# Patient Record
Sex: Female | Born: 1975 | Race: Black or African American | Hispanic: No | Marital: Married | Smoking: Former smoker
Health system: Southern US, Community
[De-identification: ages and names within clinical notes are randomized; demographics above are authoritative.]

## PROBLEM LIST (undated history)

## (undated) DIAGNOSIS — E119 Type 2 diabetes mellitus without complications: Secondary | ICD-10-CM

## (undated) DIAGNOSIS — I10 Essential (primary) hypertension: Secondary | ICD-10-CM

## (undated) HISTORY — PX: MYOMECTOMY: SHX85

## (undated) HISTORY — PX: VAGINAL HYSTERECTOMY: SUR661

---

## 1997-11-27 ENCOUNTER — Ambulatory Visit (HOSPITAL_COMMUNITY): Admission: RE | Admit: 1997-11-27 | Discharge: 1997-11-27 | Payer: Self-pay | Admitting: Obstetrics

## 1997-12-06 ENCOUNTER — Observation Stay (HOSPITAL_COMMUNITY): Admission: AD | Admit: 1997-12-06 | Discharge: 1997-12-07 | Payer: Self-pay | Admitting: Obstetrics

## 1998-01-04 ENCOUNTER — Ambulatory Visit (HOSPITAL_COMMUNITY): Admission: RE | Admit: 1998-01-04 | Discharge: 1998-01-04 | Payer: Self-pay | Admitting: Obstetrics

## 1998-02-22 ENCOUNTER — Ambulatory Visit (HOSPITAL_COMMUNITY): Admission: RE | Admit: 1998-02-22 | Discharge: 1998-02-22 | Payer: Self-pay | Admitting: Obstetrics

## 1998-03-12 ENCOUNTER — Ambulatory Visit (HOSPITAL_COMMUNITY): Admission: RE | Admit: 1998-03-12 | Discharge: 1998-03-12 | Payer: Self-pay | Admitting: Obstetrics

## 1998-04-13 ENCOUNTER — Observation Stay (HOSPITAL_COMMUNITY): Admission: AD | Admit: 1998-04-13 | Discharge: 1998-04-14 | Payer: Self-pay | Admitting: Obstetrics

## 1998-04-30 ENCOUNTER — Observation Stay (HOSPITAL_COMMUNITY): Admission: AD | Admit: 1998-04-30 | Discharge: 1998-05-01 | Payer: Self-pay | Admitting: Obstetrics

## 1998-05-10 ENCOUNTER — Inpatient Hospital Stay (HOSPITAL_COMMUNITY): Admission: AD | Admit: 1998-05-10 | Discharge: 1998-05-14 | Payer: Self-pay | Admitting: Obstetrics

## 1998-11-13 ENCOUNTER — Emergency Department (HOSPITAL_COMMUNITY): Admission: EM | Admit: 1998-11-13 | Discharge: 1998-11-13 | Payer: Self-pay | Admitting: Emergency Medicine

## 1999-07-07 ENCOUNTER — Encounter: Payer: Self-pay | Admitting: Emergency Medicine

## 1999-07-07 ENCOUNTER — Emergency Department (HOSPITAL_COMMUNITY): Admission: EM | Admit: 1999-07-07 | Discharge: 1999-07-07 | Payer: Self-pay | Admitting: Emergency Medicine

## 2000-06-21 ENCOUNTER — Emergency Department (HOSPITAL_COMMUNITY): Admission: EM | Admit: 2000-06-21 | Discharge: 2000-06-21 | Payer: Self-pay | Admitting: Emergency Medicine

## 2000-12-13 ENCOUNTER — Encounter: Payer: Self-pay | Admitting: Obstetrics & Gynecology

## 2000-12-13 ENCOUNTER — Inpatient Hospital Stay (HOSPITAL_COMMUNITY): Admission: AD | Admit: 2000-12-13 | Discharge: 2000-12-15 | Payer: Self-pay | Admitting: Obstetrics & Gynecology

## 2001-10-14 ENCOUNTER — Encounter: Payer: Self-pay | Admitting: Family Medicine

## 2001-10-14 ENCOUNTER — Encounter: Admission: RE | Admit: 2001-10-14 | Discharge: 2001-10-14 | Payer: Self-pay | Admitting: Family Medicine

## 2003-03-22 ENCOUNTER — Emergency Department (HOSPITAL_COMMUNITY): Admission: EM | Admit: 2003-03-22 | Discharge: 2003-03-22 | Payer: Self-pay | Admitting: Emergency Medicine

## 2004-02-08 ENCOUNTER — Inpatient Hospital Stay (HOSPITAL_COMMUNITY): Admission: AD | Admit: 2004-02-08 | Discharge: 2004-02-08 | Payer: Self-pay | Admitting: Obstetrics & Gynecology

## 2004-02-19 ENCOUNTER — Encounter: Admission: RE | Admit: 2004-02-19 | Discharge: 2004-02-19 | Payer: Self-pay | Admitting: Obstetrics and Gynecology

## 2006-08-23 ENCOUNTER — Emergency Department (HOSPITAL_COMMUNITY): Admission: EM | Admit: 2006-08-23 | Discharge: 2006-08-23 | Payer: Self-pay | Admitting: Emergency Medicine

## 2007-09-06 ENCOUNTER — Inpatient Hospital Stay (HOSPITAL_COMMUNITY): Admission: AD | Admit: 2007-09-06 | Discharge: 2007-09-06 | Payer: Self-pay | Admitting: Obstetrics & Gynecology

## 2007-09-27 ENCOUNTER — Ambulatory Visit (HOSPITAL_COMMUNITY): Admission: RE | Admit: 2007-09-27 | Discharge: 2007-09-27 | Payer: Self-pay | Admitting: Obstetrics & Gynecology

## 2007-09-28 ENCOUNTER — Ambulatory Visit: Payer: Self-pay | Admitting: *Deleted

## 2007-09-28 ENCOUNTER — Encounter: Payer: Self-pay | Admitting: Obstetrics & Gynecology

## 2008-03-16 ENCOUNTER — Emergency Department (HOSPITAL_COMMUNITY): Admission: EM | Admit: 2008-03-16 | Discharge: 2008-03-16 | Payer: Self-pay | Admitting: Emergency Medicine

## 2008-07-30 ENCOUNTER — Emergency Department (HOSPITAL_COMMUNITY): Admission: EM | Admit: 2008-07-30 | Discharge: 2008-07-30 | Payer: Self-pay | Admitting: Emergency Medicine

## 2010-09-03 ENCOUNTER — Emergency Department (HOSPITAL_COMMUNITY): Admission: EM | Admit: 2010-09-03 | Discharge: 2010-09-03 | Payer: Self-pay | Admitting: Family Medicine

## 2010-09-10 ENCOUNTER — Ambulatory Visit: Payer: Self-pay | Admitting: Family Medicine

## 2010-09-10 ENCOUNTER — Other Ambulatory Visit
Admission: RE | Admit: 2010-09-10 | Discharge: 2010-09-10 | Payer: Self-pay | Source: Home / Self Care | Admitting: Family Medicine

## 2010-09-10 LAB — CONVERTED CEMR LAB
HCT: 25.1 % — ABNORMAL LOW (ref 36.0–46.0)
MCV: 60.9 fL — ABNORMAL LOW (ref 78.0–100.0)
Platelets: 370 10*3/uL (ref 150–400)
RBC: 4.12 M/uL (ref 3.87–5.11)
RDW: 23.8 % — ABNORMAL HIGH (ref 11.5–15.5)

## 2010-09-19 ENCOUNTER — Ambulatory Visit (HOSPITAL_COMMUNITY)
Admission: RE | Admit: 2010-09-19 | Discharge: 2010-09-19 | Payer: Self-pay | Source: Home / Self Care | Admitting: Family Medicine

## 2010-09-29 ENCOUNTER — Ambulatory Visit: Payer: Self-pay | Admitting: Family Medicine

## 2010-10-07 ENCOUNTER — Ambulatory Visit: Payer: Self-pay

## 2010-10-09 ENCOUNTER — Encounter: Payer: Self-pay | Admitting: *Deleted

## 2010-10-22 ENCOUNTER — Other Ambulatory Visit
Admission: RE | Admit: 2010-10-22 | Discharge: 2010-10-22 | Payer: Self-pay | Source: Home / Self Care | Admitting: Family Medicine

## 2010-10-22 ENCOUNTER — Ambulatory Visit: Payer: Self-pay | Admitting: Obstetrics and Gynecology

## 2010-10-23 ENCOUNTER — Ambulatory Visit: Payer: Self-pay | Admitting: Family Medicine

## 2010-10-23 ENCOUNTER — Encounter: Payer: Self-pay | Admitting: Family Medicine

## 2010-10-23 DIAGNOSIS — E669 Obesity, unspecified: Secondary | ICD-10-CM

## 2010-10-23 DIAGNOSIS — I10 Essential (primary) hypertension: Secondary | ICD-10-CM | POA: Insufficient documentation

## 2010-10-23 DIAGNOSIS — D500 Iron deficiency anemia secondary to blood loss (chronic): Secondary | ICD-10-CM | POA: Insufficient documentation

## 2010-10-23 LAB — CONVERTED CEMR LAB
BUN: 10 mg/dL (ref 6–23)
Calcium: 9.8 mg/dL (ref 8.4–10.5)
Creatinine, Ser: 0.69 mg/dL (ref 0.40–1.20)
Hemoglobin: 8 g/dL — ABNORMAL LOW (ref 12.0–15.0)
MCHC: 28.8 g/dL — ABNORMAL LOW (ref 30.0–36.0)
Platelets: 368 10*3/uL (ref 150–400)
RDW: 24.4 % — ABNORMAL HIGH (ref 11.5–15.5)
WBC: 8 10*3/uL (ref 4.0–10.5)

## 2010-10-26 HISTORY — PX: VAGINAL HYSTERECTOMY: SUR661

## 2010-11-05 ENCOUNTER — Ambulatory Visit: Admit: 2010-11-05 | Payer: Self-pay | Admitting: Obstetrics and Gynecology

## 2010-11-12 ENCOUNTER — Ambulatory Visit: Admit: 2010-11-12 | Payer: Self-pay | Admitting: Obstetrics and Gynecology

## 2010-11-14 ENCOUNTER — Ambulatory Visit: Admit: 2010-11-14 | Payer: Self-pay

## 2010-11-17 ENCOUNTER — Inpatient Hospital Stay (HOSPITAL_COMMUNITY)
Admission: AD | Admit: 2010-11-17 | Discharge: 2010-11-17 | Payer: Self-pay | Source: Home / Self Care | Attending: Obstetrics & Gynecology | Admitting: Obstetrics & Gynecology

## 2010-11-18 LAB — URINALYSIS, ROUTINE W REFLEX MICROSCOPIC
Nitrite: NEGATIVE
Specific Gravity, Urine: 1.02 (ref 1.005–1.030)
Urobilinogen, UA: 0.2 mg/dL (ref 0.0–1.0)
pH: 5.5 (ref 5.0–8.0)

## 2010-11-18 LAB — URINE MICROSCOPIC-ADD ON

## 2010-11-18 LAB — POCT PREGNANCY, URINE: Preg Test, Ur: NEGATIVE

## 2010-11-20 ENCOUNTER — Ambulatory Visit: Admit: 2010-11-20 | Payer: Self-pay

## 2010-11-27 NOTE — Assessment & Plan Note (Signed)
Summary: np/referred from womens/eo   Vital Signs:  Patient profile:   35 year old female Height:      66 inches Weight:      229 pounds BMI:     37.10 Temp:     98.8 degrees F oral Pulse rate:   89 / minute Pulse rhythm:   regular BP sitting:   171 / 94  (left arm) Cuff size:   regular  Vitals Entered By: Loralee Pacas CMA (October 23, 2010 9:57 AM)   CC: referred from [facility]   Primary Yazaira Speas:  Dessa Phi   CC:  referred from [facility].  History of Present Illness: CC: HTN  1. HTN: Pt referred from [facility] where she is being worked-up and treated for uterine fibroid and menorrahgia. During her visits she was found to be hypertensive with SBPs: 150s and DBP 90s-100. She has a hx of HTN diagnosed 2 years ago. She was not taking medcations until 09/03/10 when she went urgent care and was found t have BP of 190/110. T  Upon ROS: she reports ocassionally seeing "stars" in her visual field, she last saw them this AM. She also reports decreased exercise tolerance and becoming more SOB when walking upstairs.  She denies frequent HAs, CP,  focal weakness, tingling/numbness. Discolored urine. She also denies intolerance to heat/cold.    2. Obesity: BMI 37 Pt has had a 60 lbs weight gain over the last year. She admits to the separation between herself and her husband being a major stressor. She reports increased intake of high calorie foods (including fried foods) and  decreased activity. She admits to worsening snoring over the last year.    Habits & Providers  Alcohol-Tobacco-Diet     Tobacco Status: quit  Past History:  Past Medical History: Hypertension Abnormal Pap- LGSIL s/p colposcopy 10/22/10.  Past Surgical History: C-section 1999 G3P0313 last pregnancy --> twins  Family History: Family History Hypertension- mother, maternal gradmother Diabetes- grandmother Father-deceased, accident on the job. Mother-living, 55.  No siblings.   Social History: Technical sales engineer  at [employer] Separated 3 children- 57, 53 yo twins.  Former Smoker quit in 1996 ETOH-ocassional IDU- denies No regular exercise Poor diet.  Occupation:  employed Smoking Status:  quit  Review of Systems       As per HPI  Physical Exam  General:  Well-developed,well-nourished,in no acute distress; alert,appropriate and cooperative throughout examination Eyes:  No corneal or conjunctival inflammation noted. EOMI. Perrla Lungs:  Normal respiratory effort, chest expands symmetrically. Lungs are clear to auscultation, no crackles or wheezes. Heart:  Normal rate and regular rhythm. S1 and S2 normal without gallop, murmur, click, rub or other extra sounds. Abdomen:  Bowel sounds positive,abdomen soft and non-tender.  Skin:  Intact without suspicious lesions or rashes   Impression & Recommendations:  Problem # 1:  HYPERTENSION (ICD-401.9) Uncontrolled primary HTN. Will check a BMET. Cr for ACE started at urgent care on 09/03/10. Obtained  baseline EKG NS, no ST elevation/depression, normal axis. Her updated medication list for this problem includes:    Lisinopril-hydrochlorothiazide 20-25 Mg Tabs (Lisinopril-hydrochlorothiazide) ..... One tab daily in am.  Orders: Basic Met-[facility] [id number]) Basic Met-[facility] [id number]) CBC-[facility] [id number]) 12 Lead EKG (12 Lead EKG)Future Orders: Lipid-[facility] ([id number]) ... 09/26/2011  Problem # 2:  OBESITY, UNSPECIFIED (ICD-278.00) See pt instructions for weightloss plan.   Complete Medication List: 1)  Lisinopril-hydrochlorothiazide 20-25 Mg Tabs (Lisinopril-hydrochlorothiazide) .... One tab daily in am. 2)  Maryanna Shape 0.4-35 Mg-mcg Tabs (Norethindrone-eth estradiol) .Marland KitchenMarland KitchenMarland Kitchen  One tab daily. 3)  Ferrous Sulfate 325 (65 Fe) Mg Tabs (Ferrous sulfate) .... One tab daily 4)  Colace 100 Mg Caps (Docusate sodium) .... One tab one to two time a day.   Patient Instructions: 1)  Ms. Carnell, 2)  Thank you for coming in today. 3)  It was a  pleasure meeting you. 4)  For your HTN: I am adding a diuretic to your regimen. It will be in combination with your lisinopril.  5)  For weight loss: The plan we discussed is a good one to get you back to your previous weight 165 -170 lbs. 6)  Goal: 2-4lbs weight loss each mos.  7)  Plan: Walk 30-45 mins, 3x/week. Wii 1-2x/weeks with kids. 8)  Diet: cut out fried foods (replace with steamed, baked, broiled, grilled). 9)  f/u in 1 mos.  10)  Come back fasting before your f/u appt so the lab can check your cholesterol.  11)  Have a happy new year. 12)  -Dr. Armen Pickup Prescriptions: COLACE 100 MG CAPS (DOCUSATE SODIUM) one tab one to two time a day.  #60 x 3   Entered and Authorized by:   Dessa Phi MD   Signed by:   Dessa Phi MD on 10/23/2010   Method used:   Electronically to        [facility] [address] [phone]* (retail)       [address]       [place], [place]  [address]       Ph: [phone] or [phone]       Fax: [phone]   RxID:   [id number] LISINOPRIL-HYDROCHLOROTHIAZIDE 20-25 MG TABS (LISINOPRIL-HYDROCHLOROTHIAZIDE) one tab daily in AM.  #30 x 1   Entered and Authorized by:   Dessa Phi MD   Signed by:   Dessa Phi MD on 10/23/2010   Method used:   [facility]        [facility] [address] [phone]* (retail)       [address]       [place], [place]  [address]       Ph: [phone] or [phone]       Fax: [phone]   RxID:   [id number]    Orders Added: 1)  Basic Met-[facility] [[id number]] 2)  Basic Met-[facility] [[id number]] 3)  CBC-[facility] [85027] 4)  12 Lead EKG [12 Lead EKG] 5)  Lipid-[facility] [[id number]] 6)  [facility] [[address]]

## 2010-11-27 NOTE — Miscellaneous (Signed)
Summary: Do Not Reschedule  Missed NP appt w/o canceling.  Per [facility] policy is not allowed to reschedule.  Dennison Nancy RN  October 09, 2010 3:32 PM

## 2010-11-28 ENCOUNTER — Encounter (HOSPITAL_COMMUNITY)
Admission: RE | Admit: 2010-11-28 | Discharge: 2010-11-28 | Disposition: A | Discharge status: Home / Self Care | Payer: BC Managed Care – PPO | Source: Ambulatory Visit | Attending: Family Medicine | Admitting: Family Medicine

## 2010-11-28 DIAGNOSIS — Z01812 Encounter for preprocedural laboratory examination: Secondary | ICD-10-CM | POA: Insufficient documentation

## 2010-11-28 LAB — BASIC METABOLIC PANEL
Calcium: 9.1 mg/dL (ref 8.4–10.5)
Creatinine, Ser: 0.6 mg/dL (ref 0.4–1.2)
GFR calc Af Amer: >60 mL/min (ref >60)
GFR calc non Af Amer: >60 mL/min (ref >60)
Potassium: 3.7 mEq/L (ref 3.5–5.1)

## 2010-11-28 LAB — CBC: WBC: 6.4 10*3/uL (ref 4.0–10.5)

## 2010-11-28 LAB — SURGICAL PCR SCREEN: MRSA, PCR: NEGATIVE

## 2010-12-03 ENCOUNTER — Other Ambulatory Visit: Payer: Self-pay | Admitting: Family Medicine

## 2010-12-03 ENCOUNTER — Ambulatory Visit (HOSPITAL_COMMUNITY)
Admission: RE | Admit: 2010-12-03 | Discharge: 2010-12-04 | Disposition: A | Discharge status: Home / Self Care | Payer: BC Managed Care – PPO | Source: Ambulatory Visit | Attending: Family Medicine | Admitting: Family Medicine

## 2010-12-03 DIAGNOSIS — D649 Anemia, unspecified: Secondary | ICD-10-CM | POA: Insufficient documentation

## 2010-12-03 DIAGNOSIS — N920 Excessive and frequent menstruation with regular cycle: Secondary | ICD-10-CM

## 2010-12-03 DIAGNOSIS — D259 Leiomyoma of uterus, unspecified: Secondary | ICD-10-CM

## 2010-12-03 DIAGNOSIS — N8000 Endometriosis of the uterus, unspecified: Secondary | ICD-10-CM | POA: Insufficient documentation

## 2010-12-03 LAB — ABO/RH: ABO/RH(D): B POS

## 2010-12-04 LAB — CBC
HCT: 23.5 % — ABNORMAL LOW (ref 36.0–46.0)
Hemoglobin: 6.9 g/dL — CL (ref 12.0–15.0)
RBC: 3.36 MIL/uL — ABNORMAL LOW (ref 3.87–5.11)
WBC: 12.4 10*3/uL — ABNORMAL HIGH (ref 4.0–10.5)

## 2010-12-07 LAB — CROSSMATCH: Unit division: 0

## 2010-12-15 NOTE — Op Note (Signed)
NAME:  Debra Mccormick, Debra Mccormick              ACCOUNT NO.:  [id number]  MEDICAL RECORD NO.:  [id number]           PATIENT TYPE:  I  LOCATION:  9320                          [facility]  PHYSICIAN:  Pearson Picou S. Debra Mccormick, M.D.   DATE OF BIRTH:  [birth year]  DATE OF PROCEDURE: DATE OF DISCHARGE:                              OPERATIVE REPORT   PREOPERATIVE DIAGNOSES: 1. Fibroid uterus. 2. Menorrhagia. 3. Anemia.  POSTOPERATIVE DIAGNOSES: 1. Fibroid uterus. 2. Menorrhagia. 3. Anemia.  PROCEDURE:  TVH.  SURGEON:  Debra Mccormick. Debra Pons, MD  ASSISTANT:  Horton Chin, MD  ANESTHESIA:  General local.  FINDINGS:  690 g multiple fibroid uterus with several areas of necrosis.  SPECIMEN:  Right tube and uterus and cervix to Pathology.  BLOOD LOSS:  400 mL.  COMPLICATIONS:  None immediately known.  REASON FOR PROCEDURE:  Briefly, patient is a 35 year old gravida 3, para 2-0-1-3 who had a history of a previous long history of fibroid uterus and had menorrhagia and pelvic pain associated with this.  She had had her hemoglobin down to 6.6 in the past, it was up to 8.8, at the time of procedure.  She had failed oral contraceptives and desired permanent treatment.  PROCEDURE IN DETAIL:  The patient was taken to the OR.  She was placed in dorsal lithotomy in [place] stirrups.  She was prepped and draped in usual sterile fashion.  She had SCDs in place, a gram of Ancef had been infused, a time-out was performed.  A weighted speculum was placed at the vagina.  The cervix was visualized and grasped with two double-tooth tenaculums on the anterior and posterior lip of the cervix.  She was injected with 20 mL of 1% lidocaine with epinephrine 1:100.  A circumferential incision was made about the vagina.  The vagina was then pushed back off the cervix.  The posterior peritoneal cavity was entered sharply with Mayo scissors and the patient peritoneum tagged to the posterior vagina.  Attempt was made to get  in anteriorly.  However, the plane could not be found accompanying this.  The patient had a previous C-section.  Sharp and blunt dissection were done until we felt we were high enough on the cervix to not injure the bladder.  A Heaney clamp was used to clamp the uterosacral ligaments bilaterally, these were cut and suture ligated it in a Heaney-type stitch and tagged.  Two more bites were taken on each side to effect the uterine vessels bilaterally. These were clamped, cut, and suture ligated.  Once the uterine vessels were obtained, we set about coring the uterus, and then removed  with multiple morcellations. It is a very large uterus 690 g and came out in multiple pieces.  Several fibroids were also removed during this with care being taken to prevent bleeding by using traction on the uterus.   The patient's right tuboovarian pedicle was Heaney clamped, was  picked up to the top of the uterus.  The patient's left tubo-ovarian wall what was thought to be a tubo-ovarian pedicle was clamped.   The remaining piece of the uterus were collected.  Several  Heaney  clamps were used just to be sure there was no bleeding, and  clamp across the final connection was done on the patient's  right side.  On the left, a free tie followed by Heaney type suture was used on the pedicle on the left side.  Several Heaney-type sutures were done on Heaney clamps and the right tube was bleeding so Heaney clamp was placed over this and this was removed and was suture ligated.  At the end of the case, the tubo-ovarian pedicles and the uterosacral ligaments were inspected as well as all the pedicles leadding up and were felt to be hemostatic.  There was some blood loss from the vaginal cuff, but that was all.  Vaginal cuff was then closed with a 0 Vicryl suture in a locked running fashion to incorporate the uterosacral ligaments on either side to the vaginal cuff.  A 2-inch packing with Estrace was then placed  inside the vagina.  A Foley catheter was placed inside the patient's bladder.  Clear yellow urine was noted.  All instrument and lap counts were correct x2.  The patient was awakened and taken to recovery room in stable condition.     Debra Mccormick. Debra Mccormick, M.D.     TSP/MEDQ  D:  12/03/2010  T:  12/04/2010  Job:  [id number]  Electronically Signed by Tinnie Gens M.D. on 12/15/2010 09:32:28 AM

## 2010-12-15 NOTE — Discharge Summary (Signed)
  NAME:  Debra Mccormick, Debra Mccormick              ACCOUNT NO.:  [id number]  MEDICAL RECORD NO.:  [id number]           PATIENT TYPE:  I  LOCATION:  9320                          [facility]  PHYSICIAN:  Anvay Tennis S. Shawnie Pons, M.D.   DATE OF BIRTH:  [birth year]  DATE OF ADMISSION:  12/03/2010 DATE OF DISCHARGE:  12/04/2010                              DISCHARGE SUMMARY   FINAL DIAGNOSES: 1. Fibroid uterus. 2. Menorrhagia. 3. Chronic anemia from above. 4. Obesity. 5. Hypertension.  PROCEDURE:  Transvaginal hysterectomy.  PERTINENT LABORATORY FINDINGS:  B positive blood type, negative antibody screen, she was crossmatched for 2 units.  Starting hemoglobin of 8.8, postoperative hemoglobin of 6.9.  Urine pregnancy test was negative. She is positive staph aureus but not MRSA on her surgical screen.  Other important findings include a 16-week size uterus with multiple fibroids. The patient had a negative endometrial biopsy, had an abnormal Pap of low-grade SIL with a normal colpo biopsy.  REASON FOR HOSPITALIZATION:  Briefly, the patient is a 35 year old G3, P 2-0-1-3 who has a history of fibroid uterus and chronic anemia.  She desired definitive treatment and was admitted for the above procedure.  HOSPITAL COURSE:  The patient was admitted.  She underwent the above procedure.  She did well postoperatively.  She was transferred to the floor.  Once there, she had stable vital signs.  She remained afebrile. She had adequate urine output.  Her Foley and vaginal packing were removed on postop day #1 without difficulty.  The patient was tolerating regular diet.  She had passed flatus.  Her pain was well-controlled and she was able to void without difficulty.  She was able to ambulate without dizziness prior to discharge.  DISCHARGE DISPOSITION AND CONDITION:  The patient discharged home in good condition.  Follow up in the GYN Clinic in approximately 2 weeks. She is given a prescription for Percocet  5/325 one to two p.o. q.4-6 h. p.r.n. severe pain.  Otherwise, she will continue her regular home medications which include Biotin 100 mcg p.o. daily, iron 27 mg 1 daily. She will discontinue her oral contraceptives.  Ibuprofen 800 mg as needed for pain and lisinopril/HCTZ 20/25 one p.o. daily.    Shelbie Proctor. Shawnie Pons, M.D.    TSP/MEDQ  D:  12/04/2010  T:  12/04/2010  Job:  [id number]  Electronically Signed by Tinnie Gens M.D. on 12/15/2010 09:27:41 AM

## 2010-12-17 ENCOUNTER — Ambulatory Visit (INDEPENDENT_AMBULATORY_CARE_PROVIDER_SITE_OTHER): Payer: BC Managed Care – PPO | Admitting: Family Medicine

## 2010-12-17 DIAGNOSIS — Z09 Encounter for follow-up examination after completed treatment for conditions other than malignant neoplasm: Secondary | ICD-10-CM

## 2011-01-06 LAB — POCT I-STAT, CHEM 8
BUN: 8 mg/dL (ref 6–23)
Creatinine, Ser: 0.7 mg/dL (ref 0.4–1.2)
HCT: 29 % — ABNORMAL LOW (ref 36.0–46.0)

## 2011-01-06 LAB — POCT PREGNANCY, URINE: Preg Test, Ur: NEGATIVE

## 2011-01-14 ENCOUNTER — Ambulatory Visit: Payer: BC Managed Care – PPO | Admitting: Physician Assistant

## 2011-01-16 NOTE — Progress Notes (Signed)
NAME:  Debra Mccormick, HAMMILL NO.:  [id number]  MEDICAL RECORD NO.:  [id number]           PATIENT TYPE:  A  LOCATION:  [facility]                   [facility]  PHYSICIAN:  Tinnie Gens, MD        DATE OF BIRTH:  08/05/1976  DATE OF SERVICE:  12/17/2010                                 CLINIC NOTE  CHIEF COMPLAINT:  Surgical followup.  HISTORY OF PRESENT ILLNESS:  The patient is a 35 year old gravida 3, para 2-0-1-3 who underwent TVH for a large fibroid uterus who returned on December 03, 2010.  She returns today for 2-week followup.  She is without complaint.  She denies vaginal discharge, vaginal bleeding, fevers, chills, nausea, vomiting, diarrhea.  She did have some constipation that seems to be improving.  She is not requiring pain medicine at this time.  She feels like she is becoming more like herself and doing well every day.  Her pathology is reviewed today shows secretory phase endometrium with no hyperplasia or malignancy, benign fibroids up to 6 cm, uterine adenomyosis, benign cervical mucosa, uterine serosal adhesions, and right and left fallopian tube transected.  PHYSICAL EXAMINATION:  VITAL SIGNS:  As noted in the chart.  Blood pressure is mildly elevated at 146/94, weight is 230 pounds, pulse 89, temperature 97.9. ABDOMEN:  Soft, nontender, nondistended. GU:  Normal external female genitalia.  BUS is normal.  Vagina is pink and rugated.  Sutures are visible on the top of the cuff.  On bimanual, there is no pelvic mass or significant tenderness.  IMPRESSION:  Status post total vaginal hysterectomy for fibroid uterus, doing exceedingly well.  PLAN:  We will follow up in 4 weeks for final postop check and release back to work.          ______________________________ Tinnie Gens, MD    TP/MEDQ  D:  12/17/2010  T:  12/18/2010  Job:  [id number]

## 2011-01-22 ENCOUNTER — Ambulatory Visit (INDEPENDENT_AMBULATORY_CARE_PROVIDER_SITE_OTHER): Payer: BC Managed Care – PPO | Admitting: Physician Assistant

## 2011-01-22 DIAGNOSIS — Z09 Encounter for follow-up examination after completed treatment for conditions other than malignant neoplasm: Secondary | ICD-10-CM

## 2011-01-23 NOTE — Progress Notes (Signed)
NAME:  Debra Mccormick, Debra Mccormick              ACCOUNT NO.:  [id number]  MEDICAL RECORD NO.:  [id number]           PATIENT TYPE:  A  [facility]ATION:  [facility]                   [facility]  PHYSICIAN:  Maylon Cos, CNM    DATE OF BIRTH:  1976-03-14  DATE OF SERVICE:  01/22/2011                                 CLINIC NOTE  The patient is being seen in GYN Clinic at [facility].  REASON FOR TODAY'S VISIT:  Eight week followup postop check from a TVH.  HISTORY OF PRESENT ILLNESS:  The patient is a 35 year old gravida 3, para 2-0-1-3 who underwent a TVH for large fibroid uterus on December 03, 2010.  She was seen for 4-week postop visit on December 17, 2010,  and was doing excellent.  She returns today with no complaints.  She is not requiring any pain medications.  No vaginal discharge.  No abnormal pain.  Desiring to return back to work.  PHYSICAL EXAMINATION:  GENERAL:  Debra Mccormick is a pleasant African American female in no apparent distress. HEENT:  Grossly normal. VITAL SIGNS:  Her blood pressure is mildly elevated at 143/95, recheck is 142/89.  Her weight today is 235 and height is 66 inches.  ABDOMEN: Soft, nontender, and nondistended. GU:  Normal external genitalia.  Vagina is pink with scant discharge, irregular rugae.  Sutures have healed completely.  Cuff is intact. Bimanual exam reveals absent uterus.  No pelvic masses.  No tenderness. The patient's ovaries are still intact, but unable to be appreciated on examination.  IMPRESSION:  Status post total vaginal hysterectomy for fibroid uterus, doing excellent.  PLAN:  The patient is released back to work without restrictions.  Note is given the patient to return on Monday, January 26, 2011, without restrictions.  The patient is also instructed to follow up with her primary care provider, Dr. Armen Pickup at [facility] secondary to her mild hypertension.  She has appointment in 2 weeks. She is encouraged to increase her  fluid intake and decrease her sodium intake.  No changes are made to her hypertensive medication, but she is currently on lisinopril 10 mg p.o. daily.          ______________________________ Maylon Cos, CNM    SS/MEDQ  D:  01/22/2011  T:  01/23/2011  Job:  [id number]

## 2011-03-10 NOTE — Group Therapy Note (Signed)
NAME:  Debra Mccormick, Debra Mccormick NO.:  [id number]   MEDICAL RECORD NO.:  [id number]          PATIENT TYPE:  WOC   [facility]ATION:  [facility]                   [facility]   PHYSICIAN:  Karlton Lemon, MD      DATE OF BIRTH:  [birth year]   DATE OF SERVICE:                                  CLINIC NOTE   CHIEF COMPLAINT:  Pelvic pain with periods and heavy periods.   HISTORY OF PRESENT ILLNESS:  This is a 35 year old G3 P3-0-0-4 with  history of twins delivery.  Her last delivery was in 1999, at which time  she had a postpartum tubal ligation.  She says since that time she has  had heavy periods lasting 8 to 9 days requiring 8 pads on her heaviest  day.  She states that she soaks through her panties when she has her  bleeding.  She does not soak her sheets at night.  She states this is a  change from her previous menstrual cycles which lasted 5 days and were  lighter.  She states that her cramping has gotten worse as well.  Currently her last menstrual period ended on September 15, 2007.  She  states she has had a trial of oral contraceptive pills in the past and  they worked well but she cannot afford them anymore.  She was seen in  the MAU on September 06, 2007 at which time she had cultures drawn.  GC  and chlamydia was negative.  Hemoglobin was 8.1 at that time.  She was  started on iron.   PAST MEDICAL HISTORY:  Anemia.   PAST SURGICAL HISTORY:  1. Bilateral tubal ligation  2. Cesarean section.   MEDICATIONS:  1. Iron sulfate 1 tab twice daily.  2. Ibuprofen p.r.n.   ALLERGIES:  NO KNOWN DIAGNOSED ALLERGIES.   PHYSICAL EXAMINATION:  GENERAL:  This is a well-appearing obese female  in no distress.  VITAL SIGNS:  Temperature 98.6.  Pulse 81.  Blood pressure 148/93.  HEART:  Regular rate and rhythm.  No murmurs, rubs or gallops.  Clear to  auscultation bilaterally.  ABDOMEN:  Obese, soft, nontender to palpation.  GENITOURINARY:  Vaginal mucosa is pink and moist.   Clear discharge.  Cervix is midline without lesions noted.  Pap smear performed by manual  exam with midline uterus approximately 10 weeks in size.  No cervical  motion tenderness.  Adnexa are difficult to palpate secondary to body  habitus.   ASSESSMENT/PLAN:  This is a 35 year old African American female, G3 P3-0-  0-4 presenting with pelvic pain and heavy menstrual periods and history  of no recent Pap smear.   PLAN:  1. Pap smear performed today.  2. Options were discussed with the patient regarding management of her      dysmenorrhea including oral contraceptive pills, IUD, ablation and      hysterectomy.  The patient is self-paid and would like to have a      trial of oral contraceptive pills.  She was provided with a      prescription for Sprintec and instructed that she may fill it at  [facility] for nine dollars for a month's supply.  She is instructed      to follow up in 3 months, at which time she should have repeat      hemoglobin done.  At that time if oral contraceptive pills are not      working, other options can be discussed.           ______________________________  Karlton Lemon, MD     NS/MEDQ  D:  09/28/2007  T:  09/28/2007  Job:  [id number]

## 2011-03-13 NOTE — Discharge Summary (Signed)
[facility]  Patient:    Debra Mccormick, Debra Mccormick                   MRN: [id number] Adm. Date:  [id number] Disc. Date: [id number] Attending:  Antionette Char DictatorTurner Daniels, M.D.                           Discharge Summary  PROCEDURE:                    Abdominal ultrasound which revealed normal uterus, left hydrosalpinx with possible right hydrosalpinx, ovaries not clearly seen, but no adnexal mass.  No fluid in the cul-de-sac.  SUMMARY:                      Patient is a 35 year old G3, P1-1-1-3 whose last menstrual period was November 27, 2000.  Patient was seen in maternity admissions for low abdominal pain and back pain, vaginal discharge.  Patient ranked the pain as very severe.  She does have a history of bilateral tubal ligation and twins at [redacted] weeks gestation.  Patient did have a cesarean section in 1999.  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 Patient was afebrile on examination and was found to have soft abdomen with positive bowel sounds and tender to palpation bilaterally with guarding left greater than the right.  No CVA tenderness.  Cervical examination revealed a friable cervix with positive cervical motion tenderness.  LABORATORIES:                 Beta hCG was negative.  White blood cell count 10.9, hemoglobin 8.4, hematocrit 27.7, platelet count 365.  Wet prep revealed few yeast, no Trichomonas, no clue cells, too numerous to count white cells, too numerous to count bacteria.  GC and chlamydia negative.  Urine was found to have negative nitrites, but positive leukocyte esterase per report on an outside hospital.                                Patient was admitted to [facility] teaching service and started on IV antibiotics.  Patient was given Vicodin p.r.n. for pain.  She was started on Cipro, Flagyl, and Diflucan x 1.  She was also started on doxycycline.  Patient did well and the second day of  hospitalization was found to be greatly improved.  Her antibiotics were changed to p.o. which she tolerated well.  The following day she was deemed appropriate to be discharged to home with a total of a 14 day course of antibiotic therapy.  CONDITION ON DISCHARGE:       Good.  DISPOSITION:                  Discharged to home.  DISCHARGE MEDICATIONS:        1. Flagyl 500 mg one tab p.o. b.i.d. x 11 days.                               2. Cipro 500 mg one tab p.o. b.i.d. x 11 days.                               3. Doxycycline 100 mg one tab p.o.  b.i.d. x 11                                  days.  INSTRUCTIONS:                 Patient was given no specific activity or diet instructions.  She was told that she needs to take all of her medications for the complete course even if she starts feeling better.  She was given specific signs and symptoms to warrant further treatment.  FOLLOW-UP:                    Patient will follow-up at [facility] in two weeks.  Patient will call for an appointment.  Patient voiced agreement and understanding of the above discharge plans and had no further questions. DD:  12/15/00 TD:  12/15/00 Job: [id number]

## 2011-03-13 NOTE — Group Therapy Note (Signed)
NAME:  Debra Mccormick, Debra Mccormick                      ACCOUNT NO.:  [id number]   MEDICAL RECORD NO.:  [id number]                   PATIENT TYPE:  OUT   LOCATION:  [facility]                           FACILITY:  [facility]   PHYSICIAN:  Argentina Donovan, MD                     DATE OF BIRTH:  1976-09-12   DATE OF SERVICE:  02/19/2004                                    CLINIC NOTE   REASON FOR VISIT:  The patient is a 35 year old gravida 4 para 2-0-1-3 with  a history of a normal vaginal delivery followed by a C-section for twins age  53.  The patient was seen in the [facility] for abdominal pain a week-and-a-half ago  and treated for PID.  While she was there she had a sonogram that showed a  right pyo- or hydrosalpinx.  The patient had a history of a hospitalization  in 2002 for pelvic inflammatory disease and at that time an ultrasound was  almost identical to the one she had done a few days ago.  This is a lady who  has a normal bowel movement every day and went several days without one and  once she did have one the pain was relieved.  I think her main problem at  that time was constipation.  However, he patient was worked up and had had a  tubal ligation.  She has been extremely anemic with a hemoglobin of around  7.  She has had very heavy periods since her tubal ligation 5 years ago and  takes no iron supplement.  I have a feeling this lady has an iron deficiency  anemia which we are going to work her up for.  We are going to place her on  iron and also on oral contraceptives in order to decrease the amount of  bleeding she is doing until her blood count gets back to normal.  I have  discussed with her the reason that we should reduce the work on her heart  and make her generally feel better by increasing her hemoglobin.  Pap smear  was done.   EXAMINATION:  The patient is soft, flat, nontender, no mass or organomegaly  on abdominal exam.  External genitalia is normal, BUS within normal limits.  The  vagina is clean and well rugated.  The cervix is clean and parous.  The  uterus anterior; normal size, shape, and consistency with normal adnexa.   IMPRESSION:  1. Normal gynecological examination.  2. Severe anemia, probable iron deficiency.   PLAN:  Iron supplementation, i.e. Hemocyte Plus; and oral contraceptives,  i.e. Ovcon-35.  Also, we are drawing a ferritin, iron binding capacity, and  hemoglobin electrophoresis.  Argentina Donovan, MD    PR/MEDQ  D:  02/19/2004  T:  02/19/2004  Job:  [id number]

## 2011-07-28 LAB — RAPID STREP SCREEN (MED CTR MEBANE ONLY): Streptococcus, Group A Screen (Direct): NEGATIVE

## 2011-08-04 LAB — HEMOGLOBINOPATHY EVALUATION
Hemoglobin Other: 0 (ref 0.0–0.0)
Hgb F Quant: 0 (ref 0.0–2.0)

## 2011-08-04 LAB — URINALYSIS, ROUTINE W REFLEX MICROSCOPIC
Glucose, UA: NEGATIVE
Ketones, ur: NEGATIVE
Nitrite: NEGATIVE
Protein, ur: NEGATIVE
Specific Gravity, Urine: 1.025

## 2011-08-04 LAB — TSH: TSH: 1.488

## 2011-08-04 LAB — CBC
MCV: 59.8 — ABNORMAL LOW
RBC: 4.38
WBC: 6.6

## 2011-08-04 LAB — VON WILLEBRAND PANEL
Ristocetin Co-Factor: 148 % (ref 50–150)
Von Willebrand Ag: 214 % normal — ABNORMAL HIGH (ref 61–164)

## 2011-08-04 LAB — WET PREP, GENITAL: Trich, Wet Prep: NONE SEEN

## 2011-08-04 LAB — GC/CHLAMYDIA PROBE AMP, GENITAL
Chlamydia, DNA Probe: NEGATIVE
GC Probe Amp, Genital: NEGATIVE

## 2012-10-08 ENCOUNTER — Encounter (HOSPITAL_COMMUNITY): Payer: Self-pay

## 2012-10-08 ENCOUNTER — Emergency Department (HOSPITAL_COMMUNITY)
Admission: EM | Admit: 2012-10-08 | Discharge: 2012-10-08 | Disposition: A | Discharge status: Home / Self Care | Payer: Self-pay | Attending: Emergency Medicine | Admitting: Emergency Medicine

## 2012-10-08 DIAGNOSIS — Z79899 Other long term (current) drug therapy: Secondary | ICD-10-CM | POA: Insufficient documentation

## 2012-10-08 DIAGNOSIS — I10 Essential (primary) hypertension: Secondary | ICD-10-CM | POA: Insufficient documentation

## 2012-10-08 DIAGNOSIS — R51 Headache: Secondary | ICD-10-CM | POA: Insufficient documentation

## 2012-10-08 DIAGNOSIS — R7309 Other abnormal glucose: Secondary | ICD-10-CM | POA: Insufficient documentation

## 2012-10-08 DIAGNOSIS — Z87891 Personal history of nicotine dependence: Secondary | ICD-10-CM | POA: Insufficient documentation

## 2012-10-08 DIAGNOSIS — R739 Hyperglycemia, unspecified: Secondary | ICD-10-CM

## 2012-10-08 HISTORY — DX: Essential (primary) hypertension: I10

## 2012-10-08 LAB — URINE MICROSCOPIC-ADD ON

## 2012-10-08 LAB — URINALYSIS, ROUTINE W REFLEX MICROSCOPIC
Glucose, UA: 100 mg/dL — AB
Ketones, ur: NEGATIVE mg/dL
Nitrite: NEGATIVE
Protein, ur: NEGATIVE mg/dL
Urobilinogen, UA: 0.2 mg/dL (ref 0.0–1.0)

## 2012-10-08 LAB — BASIC METABOLIC PANEL
CO2: 23 mEq/L (ref 19–32)
Calcium: 10.1 mg/dL (ref 8.4–10.5)
Creatinine, Ser: 0.53 mg/dL (ref 0.50–1.10)
GFR calc Af Amer: >90 mL/min (ref >90)
GFR calc non Af Amer: >90 mL/min (ref >90)
Sodium: 136 mEq/L (ref 135–145)

## 2012-10-08 LAB — RAPID URINE DRUG SCREEN, HOSP PERFORMED
Amphetamines: NOT DETECTED
Opiates: NOT DETECTED

## 2012-10-08 LAB — CBC WITH DIFFERENTIAL/PLATELET
Basophils Absolute: 0 10*3/uL (ref 0.0–0.1)
Basophils Relative: 0 % (ref 0–1)
Eosinophils Relative: 2 % (ref 0–5)
Lymphocytes Relative: 40 % (ref 12–46)
MCHC: 33.9 g/dL (ref 30.0–36.0)
MCV: 84.1 fL (ref 78.0–100.0)
Monocytes Absolute: 0.3 10*3/uL (ref 0.1–1.0)
Platelets: 261 10*3/uL (ref 150–400)
RDW: 14.4 % (ref 11.5–15.5)
WBC: 5.5 10*3/uL (ref 4.0–10.5)

## 2012-10-08 MED ORDER — LISINOPRIL-HYDROCHLOROTHIAZIDE 20-25 MG PO TABS: 1.0000 | ORAL_TABLET | Freq: Every day | ORAL | Status: DC

## 2012-10-08 NOTE — ED Notes (Signed)
Pt. Is eating a box lunch.

## 2012-10-08 NOTE — ED Notes (Signed)
Dizziness began last night, hx of HTN, does not take medication.  Was taking Lisinipril but stopped it. Has a rt. Side headache also began last night

## 2012-10-08 NOTE — ED Provider Notes (Signed)
History     CSN: [id number]  Arrival date & time 10/08/12  4098   First MD Initiated Contact with Patient 10/08/12 0703      Chief Complaint  Patient presents with  . Dizziness    (Consider location/radiation/quality/duration/timing/severity/associated sxs/prior treatment) HPI Comments: Debra Mccormick is a 36 y.o. Female who complains of onset of dizziness and right frontal headache, yesterday. Symptom onset was gradual. No weakness, paresthesias, nausea, vomiting, chest pain, or shortness of breath. She stopped her lisinopril and hydrochlorothiazide several months ago, after her prescription ran out. She has no access to medical care, currently. There are no modifying factors.  The history is provided by the patient.    Past Medical History  Diagnosis Date  . Hypertension     Past Surgical History  Procedure Date  . Myomectomy     No family history on file.  History  Substance Use Topics  . Smoking status: Former Games developer  . Smokeless tobacco: Not on file  . Alcohol Use: Yes     Comment: occassional    OB History    Grav Para Term Preterm Abortions TAB SAB Ect Mult Living                  Review of Systems  All other systems reviewed and are negative.    Allergies  Review of patient's allergies indicates no known allergies.  Home Medications   Current Outpatient Rx  Name  Route  Sig  Dispense  Refill  . LISINOPRIL-HYDROCHLOROTHIAZIDE 20-25 MG PO TABS   Oral   Take 1 tablet by mouth daily.         Marland Kitchen LISINOPRIL-HYDROCHLOROTHIAZIDE 20-25 MG PO TABS   Oral   Take 1 tablet by mouth daily.   90 tablet   1     BP 152/100  Pulse 88  Temp 98.2 F (36.8 C) (Oral)  Resp 22  SpO2 98%  Physical Exam  Nursing note and vitals reviewed. Constitutional: She is oriented to person, place, and time. She appears well-developed and well-nourished.  HENT:  Head: Normocephalic and atraumatic.  Eyes: Conjunctivae normal and EOM are normal. Pupils are  equal, round, and reactive to light.  Neck: Normal range of motion and phonation normal. Neck supple.  Cardiovascular: Normal rate, regular rhythm and intact distal pulses.   Pulmonary/Chest: Effort normal and breath sounds normal. She exhibits no tenderness.  Abdominal: Soft. She exhibits no distension. There is no tenderness. There is no guarding.  Musculoskeletal: Normal range of motion.  Neurological: She is alert and oriented to person, place, and time. She has normal strength. She exhibits normal muscle tone.  Skin: Skin is warm and dry.  Psychiatric: She has a normal mood and affect. Her behavior is normal. Judgment and thought content normal.    ED Course  Procedures (including critical care time)  We'll screen for end organ damage prior to restarting medications   Date: 10/08/2012  Rate: 72  Rhythm: normal sinus rhythm  QRS Axis: normal  Intervals: normal  ST/T Wave abnormalities: nonspecific T wave changes  Conduction Disutrbances:none  Narrative Interpretation:   Old EKG Reviewed: none available    Labs Reviewed  BASIC METABOLIC PANEL - Abnormal; Notable for the following:    Glucose, Bld 247 (*)     All other components within normal limits  URINALYSIS, ROUTINE W REFLEX MICROSCOPIC - Abnormal; Notable for the following:    APPearance CLOUDY (*)     Glucose, UA 100 (*)  Hgb urine dipstick TRACE (*)     Leukocytes, UA TRACE (*)     All other components within normal limits  URINE MICROSCOPIC-ADD ON - Abnormal; Notable for the following:    Squamous Epithelial / LPF FEW (*)     All other components within normal limits  CBC WITH DIFFERENTIAL  URINE RAPID DRUG SCREEN (HOSP PERFORMED)   Nursing notes, applicable records and vitals reviewed.  Radiologic Images/Reports reviewed.    1. Hypertension   2. Hyperglycemia       MDM  Hypertension, secondary to medication noncompliance. Doubt acute hypertensive urgency. Doubt metabolic instability, serious  bacterial infection or impending vascular collapse; the patient is stable for discharge.   Plan: Home Medications- Lisinopril, HCTZ; Home Treatments- rest; Recommended follow up- PCP of choice asap, for recheck CBG        [name], MD 10/08/12 1723

## 2014-04-02 ENCOUNTER — Emergency Department (HOSPITAL_COMMUNITY)
Admission: EM | Admit: 2014-04-02 | Discharge: 2014-04-03 | Disposition: A | Discharge status: Home / Self Care | Payer: BC Managed Care – PPO | Attending: Emergency Medicine | Admitting: Emergency Medicine

## 2014-04-02 ENCOUNTER — Encounter (HOSPITAL_COMMUNITY): Payer: Self-pay | Admitting: Emergency Medicine

## 2014-04-02 ENCOUNTER — Emergency Department (HOSPITAL_COMMUNITY): Payer: BC Managed Care – PPO

## 2014-04-02 DIAGNOSIS — R42 Dizziness and giddiness: Secondary | ICD-10-CM

## 2014-04-02 DIAGNOSIS — Z87891 Personal history of nicotine dependence: Secondary | ICD-10-CM | POA: Insufficient documentation

## 2014-04-02 DIAGNOSIS — L03818 Cellulitis of other sites: Principal | ICD-10-CM

## 2014-04-02 DIAGNOSIS — L02818 Cutaneous abscess of other sites: Secondary | ICD-10-CM | POA: Insufficient documentation

## 2014-04-02 DIAGNOSIS — R51 Headache: Secondary | ICD-10-CM | POA: Insufficient documentation

## 2014-04-02 DIAGNOSIS — E119 Type 2 diabetes mellitus without complications: Secondary | ICD-10-CM | POA: Insufficient documentation

## 2014-04-02 DIAGNOSIS — I10 Essential (primary) hypertension: Secondary | ICD-10-CM | POA: Insufficient documentation

## 2014-04-02 DIAGNOSIS — L02811 Cutaneous abscess of head [any part, except face]: Secondary | ICD-10-CM

## 2014-04-02 DIAGNOSIS — Z79899 Other long term (current) drug therapy: Secondary | ICD-10-CM | POA: Insufficient documentation

## 2014-04-02 DIAGNOSIS — Z792 Long term (current) use of antibiotics: Secondary | ICD-10-CM | POA: Insufficient documentation

## 2014-04-02 HISTORY — DX: Type 2 diabetes mellitus without complications: E11.9

## 2014-04-02 LAB — COMPREHENSIVE METABOLIC PANEL
ALK PHOS: 42 U/L (ref 39–117)
ALT: 24 U/L (ref 0–35)
AST: 25 U/L (ref 0–37)
Albumin: 4 g/dL (ref 3.5–5.2)
BUN: 12 mg/dL (ref 6–23)
CALCIUM: 10.4 mg/dL (ref 8.4–10.5)
CO2: 23 mEq/L (ref 19–32)
Chloride: 96 mEq/L (ref 96–112)
Creatinine, Ser: 0.65 mg/dL (ref 0.50–1.10)
GLUCOSE: 189 mg/dL — AB (ref 70–99)
POTASSIUM: 3.8 meq/L (ref 3.7–5.3)
Sodium: 136 mEq/L — ABNORMAL LOW (ref 137–147)
TOTAL PROTEIN: 7.9 g/dL (ref 6.0–8.3)
Total Bilirubin: 0.3 mg/dL (ref 0.3–1.2)

## 2014-04-02 LAB — CBC
HEMATOCRIT: 36.3 % (ref 36.0–46.0)
HEMOGLOBIN: 11.9 g/dL — AB (ref 12.0–15.0)
MCH: 27.6 pg (ref 26.0–34.0)
MCHC: 32.8 g/dL (ref 30.0–36.0)
MCV: 84.2 fL (ref 78.0–100.0)
Platelets: 245 10*3/uL (ref 150–400)
RBC: 4.31 MIL/uL (ref 3.87–5.11)
RDW: 14.5 % (ref 11.5–15.5)
WBC: 8.6 10*3/uL (ref 4.0–10.5)

## 2014-04-02 MED ORDER — HYDROCODONE-ACETAMINOPHEN 5-325 MG PO TABS
1.0000 | ORAL_TABLET | Freq: Once | ORAL | Status: AC
  Administered 2014-04-02: 1 via ORAL
  Filled 2014-04-02: qty 1

## 2014-04-02 NOTE — ED Provider Notes (Signed)
CSN: [id number]     Arrival date & time 04/02/14  1916 History   First MD Initiated Contact with Patient 04/02/14 2221     Chief Complaint  Patient presents with  . Hypertension  . Dizziness  . Cyst     (Consider location/radiation/quality/duration/timing/severity/associated sxs/prior Treatment) The history is provided by the patient and medical records. No language interpreter was used.    Debra Mccormick is a 38 y.o. female  with a hx of HTN, NIDDM presents to the Emergency Department complaining of gradual, persistent, progressively worsening HTN onset 4pm. Associated symptoms include lightheaded like her head was "swimming."  Pt reports the room was spinning a little bit but she did not feel like she would pass out.  Pt reports the lightheadednes was persistent and resolved around 10PM.  Pt reports lingering headache.  BP normally runs 789 systolic.  Nothing makes the symptoms better or worse.  Pt denies stress, high salt intake to prompt the HTN.  Pt denies fever, chills, chest pain, SOB, abd pain, N/V/D, weakness, syncope, dysuria. Pt reports she takes Norvasc QD and took it last night as usual.   Pt also c/o a "knot" on the back of her head that she noticed yesterday.  She then noticed a second not today. She denies fever, chills, neck stiffness, neck pain.  Past Medical History  Diagnosis Date  . Hypertension   . Diabetes mellitus without complication    Past Surgical History  Procedure Laterality Date  . Myomectomy     No family history on file. History  Substance Use Topics  . Smoking status: Former Research scientist (life sciences)  . Smokeless tobacco: Not on file  . Alcohol Use: Yes     Comment: occassional   OB History   Grav Para Term Preterm Abortions TAB SAB Ect Mult Living                 Review of Systems  Constitutional: Negative for fever, diaphoresis, appetite change, fatigue and unexpected weight change.  HENT: Negative for mouth sores.   Eyes: Negative for visual disturbance.   Respiratory: Negative for cough, chest tightness, shortness of breath and wheezing.   Cardiovascular: Negative for chest pain.  Gastrointestinal: Negative for nausea, vomiting, abdominal pain, diarrhea and constipation.  Endocrine: Negative for polydipsia, polyphagia and polyuria.  Genitourinary: Negative for dysuria, urgency, frequency and hematuria.  Musculoskeletal: Negative for back pain and neck stiffness.  Skin: Positive for wound. Negative for rash.  Allergic/Immunologic: Negative for immunocompromised state.  Neurological: Positive for light-headedness and headaches. Negative for syncope, weakness and numbness.  Hematological: Does not bruise/bleed easily.  Psychiatric/Behavioral: Negative for sleep disturbance. The patient is not nervous/anxious.       Allergies  Orange fruit and Tomato  Home Medications   Prior to Admission medications   Medication Sig Start Date End Date Taking? Authorizing Provider  amLODipine (NORVASC) 5 MG tablet Take 5 mg by mouth daily. 02/11/14  Yes Historical Provider, MD  Cholecalciferol (VITAMIN D PO) Take 1 tablet by mouth daily.   Yes Historical Provider, MD  Linagliptin-Metformin HCl (JENTADUETO) 2.5-850 MG TABS Take 1 tablet by mouth 2 (two) times daily.   Yes Historical Provider, MD  rosuvastatin (CRESTOR) 10 MG tablet Take 10 mg by mouth daily.   Yes Historical Provider, MD  cephALEXin (KEFLEX) 500 MG capsule Take 1 capsule (500 mg total) by mouth 4 (four) times daily. 04/03/14   Jetaun Colbath, PA-C   BP 128/77  Pulse 81  Temp(Src) 98.3 F (  36.8 C) (Oral)  Resp 18  Wt 236 lb 6 oz (107.219 kg)  SpO2 96% Physical Exam  Nursing note and vitals reviewed. Constitutional: She is oriented to person, place, and time. She appears well-developed and well-nourished. No distress.  HENT:  Head: Normocephalic and atraumatic.  Mouth/Throat: Oropharynx is clear and moist.  1x1cm area of induration and fluctuance to the midline posterior scalp   Eyes: Conjunctivae and EOM are normal. Pupils are equal, round, and reactive to light. No scleral icterus.  No horizontal, vertical or rotational nystagmus  Neck: Normal range of motion. Neck supple.  Full active and passive ROM without pain No midline or paraspinal tenderness No nuchal rigidity or meningeal signs  Cardiovascular: Normal rate, regular rhythm, normal heart sounds and intact distal pulses.   No murmur heard. Pulmonary/Chest: Effort normal and breath sounds normal. No respiratory distress. She has no wheezes. She has no rales.  Clear and equal  Abdominal: Soft. Bowel sounds are normal. She exhibits no distension. There is no tenderness. There is no rebound and no guarding.  Soft and nontender  Musculoskeletal: Normal range of motion.  Lymphadenopathy:       Head (right side): No occipital adenopathy present.       Head (left side): Occipital adenopathy present.    She has no cervical adenopathy.  Left occipital lymphadenopathy - discrete, tender and mobile  Neurological: She is alert and oriented to person, place, and time. She has normal reflexes. No cranial nerve deficit. She exhibits normal muscle tone. Coordination normal.  Mental Status:  Alert, oriented, thought content appropriate. Speech fluent without evidence of aphasia. Able to follow 2 step commands without difficulty.  Cranial Nerves:  II:  Peripheral visual fields grossly normal, pupils equal, round, reactive to light III,IV, VI: ptosis not present, extra-ocular motions intact bilaterally  V,VII: smile symmetric, facial light touch sensation equal VIII: hearing grossly normal bilaterally  IX,X: gag reflex present  XI: bilateral shoulder shrug equal and strong XII: midline tongue extension  Motor:  5/5 in upper and lower extremities bilaterally including strong and equal grip strength and dorsiflexion/plantar flexion Sensory: Pinprick and light touch normal in all extremities.  Deep Tendon Reflexes: 2+ and  symmetric  Cerebellar: normal finger-to-nose with bilateral upper extremities Gait: normal gait and balance CV: distal pulses palpable throughout   Skin: Skin is warm and dry. No rash noted. She is not diaphoretic. There is erythema.  Psychiatric: She has a normal mood and affect. Her behavior is normal. Judgment and thought content normal.    ED Course  INCISION AND DRAINAGE Date/Time: 04/03/2014 12:44 AM Performed by: Abigail Butts Authorized by: Abigail Butts Consent: Verbal consent obtained. Risks and benefits: risks, benefits and alternatives were discussed Consent given by: patient Patient understanding: patient states understanding of the procedure being performed Patient consent: the patient's understanding of the procedure matches consent given Procedure consent: procedure consent matches procedure scheduled Relevant documents: relevant documents present and verified Site marked: the operative site was marked Required items: required blood products, implants, devices, and special equipment available Patient identity confirmed: verbally with patient and arm band Time out: Immediately prior to procedure a "time out" was called to verify the correct patient, procedure, equipment, support staff and site/side marked as required. Type: abscess Body area: head/neck Location details: scalp Anesthesia: local infiltration Local anesthetic: lidocaine 2% without epinephrine Anesthetic total: 3 ml Patient sedated: no Scalpel size: 11 Incision type: single straight Complexity: simple Drainage: purulent Drainage amount: moderate Wound treatment: wound left  open Packing material: none Patient tolerance: Patient tolerated the procedure well with no immediate complications.   (including critical care time) Labs Review Labs Reviewed  CBC - Abnormal; Notable for the following:    Hemoglobin 11.9 (*)    All other components within normal limits  COMPREHENSIVE METABOLIC  PANEL - Abnormal; Notable for the following:    Sodium 136 (*)    Glucose, Bld 189 (*)    All other components within normal limits    Imaging Review Ct Head Wo Contrast  04/02/2014   CLINICAL DATA:  High blood pressure.  Dizziness.  EXAM: CT HEAD WITHOUT CONTRAST  TECHNIQUE: Contiguous axial images were obtained from the base of the skull through the vertex without contrast.  COMPARISON:  None  FINDINGS: Normal appearance of the intracranial structures. No evidence for acute hemorrhage, mass lesion, midline shift, hydrocephalus or large infarct. No acute bony abnormality. The visualized sinuses are clear.  IMPRESSION: No acute intracranial abnormality.   Electronically Signed   By: Rolla Flatten M.D.   On: 04/02/2014 23:35     EKG Interpretation None      MDM   Final diagnoses:  HTN (hypertension)  Dizziness  Abscess, scalp   Debra Mccormick presents with complaints of an abscess to the back of her head and dizziness accompanied by HTN.  Labs unremarkable. Patient without neurologic deficit.  Will obtain CT scan due to hypertension complaints of headache and strange dizziness.  Patient with skin abscess amenable to incision and drainage.  Abscess was not large enough to warrant packing or drain,  wound recheck in 2 days. Encouraged home warm soaks and flushing.  Mild signs of cellulitis is surrounding skin.  Will d/c to home with Keflex as patient is a non-insulin-dependent diabetic. She is to have a wound check in 48 hours.  CT scan without evidence of intercranial abnormality. Patient's headache resolved with analgesia here in the emergency department. Patient without neurologic deficit on repeat neurologic exam.  No persistent dizziness or headache. Patient's blood pressure has decreased spontaneously.  No evidence of hypertensive urgency here in the emergency department.   Patient noted to be initially hypertensive in the emergency department.  I have personally reviewed patient's  vitals, nursing note and any pertinent labs or imaging.  I performed an undressed physical exam.    At this time, it has been determined that no acute conditions requiring further emergency intervention. The patient/guardian have been advised of the diagnosis and plan. I reviewed all labs and imaging including any potential incidental findings. We have discussed signs and symptoms that warrant return to the ED, such as vision changes, no headaches, syncope.  Patient/guardian has voiced understanding and agreed to follow-up with the PCP or specialist in 3 days for further evaluation of today's symptoms.  Vital signs are stable at discharge.   BP 128/77  Pulse 81  Temp(Src) 98.3 F (36.8 C) (Oral)  Resp 18  Wt 236 lb 6 oz (107.219 kg)  SpO2 96%         Abigail Butts, PA-C 04/03/14 0221

## 2014-04-02 NOTE — ED Notes (Signed)
Patient states she has high blood pressure and it is elevated today.  Patient states she is dizzy.  Patient states that the dizziness started at around 1600.  Patient denies any nausea or vomiting.  Patient states gait is good.  Patient has good, equal pedal pushes and hand grips.  No slurred speech or double vision.  Patient is CAOx3.  She states she also has a "knot" on the back of her head she would like looked at.

## 2014-04-03 MED ORDER — CEPHALEXIN 500 MG PO CAPS: 500.0000 mg | ORAL_CAPSULE | Freq: Four times a day (QID) | ORAL | Status: DC

## 2014-04-03 NOTE — ED Notes (Signed)
Pt A&OX4, ambulatory at d/c with steady gait, pt declined wheelchair. NAD.

## 2014-04-03 NOTE — Discharge Instructions (Signed)
1. Medications: Keflex, usual home medications 2. Treatment: rest, drink plenty of fluids, compresses at home 3. Follow Up: Please followup with your primary doctor for discussion within 3 days of your diagnoses and further evaluation after today's visit;  return to emergency department for worsening symptoms, worsening headaches or syncopal episodes.   Abscess An abscess is an infected area that contains a collection of pus and debris.It can occur in almost any part of the body. An abscess is also known as a furuncle or boil. CAUSES  An abscess occurs when tissue gets infected. This can occur from blockage of oil or sweat glands, infection of hair follicles, or a minor injury to the skin. As the body tries to fight the infection, pus collects in the area and creates pressure under the skin. This pressure causes pain. People with weakened immune systems have difficulty fighting infections and get certain abscesses more often.  SYMPTOMS Usually an abscess develops on the skin and becomes a painful mass that is red, warm, and tender. If the abscess forms under the skin, you may feel a moveable soft area under the skin. Some abscesses break open (rupture) on their own, but most will continue to get worse without care. The infection can spread deeper into the body and eventually into the bloodstream, causing you to feel ill.  DIAGNOSIS  Your caregiver will take your medical history and perform a physical exam. A sample of fluid may also be taken from the abscess to determine what is causing your infection. TREATMENT  Your caregiver may prescribe antibiotic medicines to fight the infection. However, taking antibiotics alone usually does not cure an abscess. Your caregiver may need to make a small cut (incision) in the abscess to drain the pus. In some cases, gauze is packed into the abscess to reduce pain and to continue draining the area. HOME CARE INSTRUCTIONS   Only take over-the-counter or  prescription medicines for pain, discomfort, or fever as directed by your caregiver.  If you were prescribed antibiotics, take them as directed. Finish them even if you start to feel better.  If gauze is used, follow your caregiver's directions for changing the gauze.  To avoid spreading the infection:  Keep your draining abscess covered with a bandage.  Wash your hands well.  Do not share personal care items, towels, or whirlpools with others.  Avoid skin contact with others.  Keep your skin and clothes clean around the abscess.  Keep all follow-up appointments as directed by your caregiver. SEEK MEDICAL CARE IF:   You have increased pain, swelling, redness, fluid drainage, or bleeding.  You have muscle aches, chills, or a general ill feeling.  You have a fever. MAKE SURE YOU:   Understand these instructions.  Will watch your condition.  Will get help right away if you are not doing well or get worse. Document Released: 07/22/2005 Document Revised: 04/12/2012 Document Reviewed: 12/25/2011 [facility] Patient Information 2014 [place].   Dizziness Dizziness is a common problem. It is a feeling of unsteadiness or lightheadedness. You may feel like you are about to faint. Dizziness can lead to injury if you stumble or fall. A person of any age group can suffer from dizziness, but dizziness is more common in older adults. CAUSES  Dizziness can be caused by many different things, including:  Middle ear problems.  Standing for too long.  Infections.  An allergic reaction.  Aging.  An emotional response to something, such as the sight of blood.  Side  effects of medicines.  Fatigue.  Problems with circulation or blood pressure.  Excess use of alcohol, medicines, or illegal drug use.  Breathing too fast (hyperventilation).  An arrhythmia or problems with your heart rhythm.  Low red blood cell count (anemia).  Pregnancy.  Vomiting, diarrhea, fever, or  other illnesses that cause dehydration.  Diseases or conditions such as Parkinson's disease, high blood pressure (hypertension), diabetes, and thyroid problems.  Exposure to extreme heat. DIAGNOSIS  To find the cause of your dizziness, your caregiver may do a physical exam, lab tests, radiologic imaging scans, or an electrocardiography test (ECG).  TREATMENT  Treatment of dizziness depends on the cause of your symptoms and can vary greatly. HOME CARE INSTRUCTIONS   Drink enough fluids to keep your urine clear or pale yellow. This is especially important in very hot weather. In the elderly, it is also important in cold weather.  If your dizziness is caused by medicines, take them exactly as directed. When taking blood pressure medicines, it is especially important to get up slowly.  Rise slowly from chairs and steady yourself until you feel okay.  In the morning, first sit up on the side of the bed. When this seems okay, stand slowly while holding onto something until you know your balance is fine.  If you need to stand in one place for a long time, be sure to move your legs often. Tighten and relax the muscles in your legs while standing.  If dizziness continues to be a problem, have someone stay with you for a day or two. Do this until you feel you are well enough to stay alone. Have the person call your caregiver if he or she notices changes in you that are concerning.  Do not drive or use heavy machinery if you feel dizzy.  Do not drink alcohol. SEEK IMMEDIATE MEDICAL CARE IF:   Your dizziness or lightheadedness gets worse.  You feel nauseous or vomit.  You develop problems with talking, walking, weakness, or using your arms, hands, or legs.  You are not thinking clearly or you have difficulty forming sentences. It may take a friend or family member to determine if your thinking is normal.  You develop chest pain, abdominal pain, shortness of breath, or sweating.  Your vision  changes.  You notice any bleeding.  You have side effects from medicine that seems to be getting worse rather than better. MAKE SURE YOU:   Understand these instructions.  Will watch your condition.  Will get help right away if you are not doing well or get worse. Document Released: 04/07/2001 Document Revised: 01/04/2012 Document Reviewed: 05/01/2011 [facility] Patient Information 2014 [place], [place].

## 2014-04-05 NOTE — ED Provider Notes (Signed)
Medical screening examination/treatment/procedure(s) were performed by non-physician practitioner and as supervising physician I was immediately available for consultation/collaboration.   EKG Interpretation None        Fredia Sorrow, MD 04/05/14 1126

## 2014-11-19 ENCOUNTER — Encounter: Payer: Self-pay | Admitting: Obstetrics & Gynecology

## 2014-11-19 ENCOUNTER — Ambulatory Visit (INDEPENDENT_AMBULATORY_CARE_PROVIDER_SITE_OTHER): Payer: BLUE CROSS/BLUE SHIELD | Admitting: Obstetrics & Gynecology

## 2014-11-19 VITALS — BP 170/92 | HR 86 | Ht 66.0 in | Wt 234.8 lb

## 2014-11-19 DIAGNOSIS — Z0000 Encounter for general adult medical examination without abnormal findings: Secondary | ICD-10-CM | POA: Diagnosis not present

## 2014-11-19 DIAGNOSIS — I10 Essential (primary) hypertension: Secondary | ICD-10-CM | POA: Diagnosis not present

## 2014-11-19 MED ORDER — HYDROCHLOROTHIAZIDE 25 MG PO TABS: 25.0000 mg | ORAL_TABLET | Freq: Every day | ORAL | Status: AC

## 2014-11-19 NOTE — Patient Instructions (Signed)

## 2014-11-19 NOTE — Progress Notes (Signed)
Patient ID: [name], female   DOB: 03-09-1976, 39 y.o.   MRN: [id number] Subjective:     Debra Mccormick is a 39 y.o. female here for a routine exam.  Current complaints: none.    Gynecologic History No LMP recorded. Patient has had a hysterectomy. 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 Marital Status: Legally Separated    Spouse Name: N/A    Number of Children: N/A  . Years of Education: N/A   Occupational History  . Not on file.   Social History Main Topics  . Smoking status: Former Research scientist (life sciences)  . Smokeless tobacco: Not on file  . Alcohol Use: Yes     Comment: occassional  . Drug Use: No  . Sexual Activity: Not on file   Other Topics Concern  . Not on file   Social History Narrative     The following portions of the patient's history were reviewed and updated as appropriate: allergies, current medications, past family history, past medical history, past social history, past surgical history and problem list.  Review of Systems Pertinent items are noted in HPI.    Objective:    BP 170/92 mmHg  Pulse 86  Ht 5\' 6"  (1.676 m)  Wt 234 lb 12.8 oz (106.505 kg)  BMI 37.92 kg/m2  General Appearance:    Alert, cooperative, no distress, appears stated  age  Head:    Normocephalic, without obvious abnormality, atraumatic              Neck:   Supple, symmetrical, trachea midline, no adenopathy;    thyroid:  no enlargement/tenderness/nodules; no carotid   bruit or JVD  Back:     Symmetric, no curvature, ROM normal, no CVA tenderness  Lungs:     Clear to auscultation bilaterally, respirations unlabored  Chest Wall:    No tenderness or deformity   Heart:    Regular rate and rhythm, S1 and S2 normal, no murmur, rub   or gallop  Breast Exam:    No tenderness, masses, or nipple abnormality  Abdomen:     Soft, non-tender, bowel sounds active all four quadrants,    no masses, no organomegaly  Genitalia:    Normal female without lesion, discharge or tenderness GU: EGBUS: no lesions Vagina: no blood in vault; cuff well healed Cervix/uterus: surgically absent Adnexa: no masses; non tender       Extremities:   Extremities normal, atraumatic, no cyanosis or edema  Pulses:   2+ and symmetric all extremities  Skin:   Skin color, texture, turgor normal, no rashes or lesions  Lymph nodes:  Cervical, supraclavicular, and axillary nodes normal        Assessment:    Healthy female exam.   Chronic HTN- not controlled on current meds   Plan:    Follow up in: 1 year.    Mammogram at age 2year. Add HCTZ 25 mg daily F/u with primary care within the next 2 weeks rec purchase blood pressure cuff form home eval

## 2015-01-28 ENCOUNTER — Ambulatory Visit (HOSPITAL_COMMUNITY)
Admission: RE | Admit: 2015-01-28 | Discharge: 2015-01-28 | Disposition: A | Discharge status: Home / Self Care | Payer: BLUE CROSS/BLUE SHIELD | Source: Ambulatory Visit | Attending: Obstetrics & Gynecology | Admitting: Obstetrics & Gynecology

## 2015-01-28 DIAGNOSIS — I10 Essential (primary) hypertension: Secondary | ICD-10-CM

## 2015-01-28 DIAGNOSIS — Z1231 Encounter for screening mammogram for malignant neoplasm of breast: Secondary | ICD-10-CM | POA: Diagnosis not present

## 2015-01-29 ENCOUNTER — Telehealth: Payer: Self-pay

## 2015-01-29 ENCOUNTER — Other Ambulatory Visit: Payer: Self-pay | Admitting: Obstetrics & Gynecology

## 2015-01-29 DIAGNOSIS — R928 Other abnormal and inconclusive findings on diagnostic imaging of breast: Secondary | ICD-10-CM

## 2015-01-29 NOTE — Telephone Encounter (Signed)
-----   Message from Lavonia Drafts, MD sent at 01/29/2015  3:34 PM EDT ----- Please call pt.  She needs f/u of her mammogram. Please make sure that she makes that appt or assist her with scheduling.  Thx, clh-S

## 2015-01-29 NOTE — Telephone Encounter (Signed)
Called patient and informed her of results and need for diagnostic mammogram. Patient requests appointment after 2PM. Diagnostic mammogram scheduled for 02/04/15 at 1430 (1450 appointment)  at the [facility] [address] Called patient and informed her of appointment date, time and location. Advised she not wear any lotions, deodorants or powders to appointment. Patient verbalized understanding and gratitude. No questions or concerns.

## 2015-02-04 ENCOUNTER — Ambulatory Visit
Admission: RE | Admit: 2015-02-04 | Discharge: 2015-02-04 | Disposition: A | Discharge status: Home / Self Care | Payer: BLUE CROSS/BLUE SHIELD | Source: Ambulatory Visit | Attending: Obstetrics & Gynecology | Admitting: Obstetrics & Gynecology

## 2015-02-04 DIAGNOSIS — R928 Other abnormal and inconclusive findings on diagnostic imaging of breast: Secondary | ICD-10-CM

## 2015-03-18 ENCOUNTER — Emergency Department (HOSPITAL_COMMUNITY)
Admission: EM | Admit: 2015-03-18 | Discharge: 2015-03-18 | Disposition: A | Discharge status: Home / Self Care | Payer: BLUE CROSS/BLUE SHIELD | Attending: Emergency Medicine | Admitting: Emergency Medicine

## 2015-03-18 ENCOUNTER — Encounter (HOSPITAL_COMMUNITY): Payer: Self-pay | Admitting: *Deleted

## 2015-03-18 DIAGNOSIS — B349 Viral infection, unspecified: Secondary | ICD-10-CM

## 2015-03-18 DIAGNOSIS — Z79899 Other long term (current) drug therapy: Secondary | ICD-10-CM | POA: Diagnosis not present

## 2015-03-18 DIAGNOSIS — E119 Type 2 diabetes mellitus without complications: Secondary | ICD-10-CM | POA: Insufficient documentation

## 2015-03-18 DIAGNOSIS — R1030 Lower abdominal pain, unspecified: Secondary | ICD-10-CM | POA: Diagnosis not present

## 2015-03-18 DIAGNOSIS — Z87891 Personal history of nicotine dependence: Secondary | ICD-10-CM | POA: Diagnosis not present

## 2015-03-18 DIAGNOSIS — I10 Essential (primary) hypertension: Secondary | ICD-10-CM | POA: Diagnosis not present

## 2015-03-18 DIAGNOSIS — R109 Unspecified abdominal pain: Secondary | ICD-10-CM

## 2015-03-18 DIAGNOSIS — R52 Pain, unspecified: Secondary | ICD-10-CM | POA: Diagnosis present

## 2015-03-18 LAB — CBC WITH DIFFERENTIAL/PLATELET
BASOS PCT: 0 % (ref 0–1)
Basophils Absolute: 0 10*3/uL (ref 0.0–0.1)
Eosinophils Absolute: 0.1 10*3/uL (ref 0.0–0.7)
Eosinophils Relative: 2 % (ref 0–5)
HCT: 37.7 % (ref 36.0–46.0)
HEMOGLOBIN: 12.2 g/dL (ref 12.0–15.0)
LYMPHS PCT: 37 % (ref 12–46)
Lymphs Abs: 3.2 10*3/uL (ref 0.7–4.0)
MCH: 27.7 pg (ref 26.0–34.0)
MCHC: 32.4 g/dL (ref 30.0–36.0)
MCV: 85.5 fL (ref 78.0–100.0)
MONO ABS: 0.5 10*3/uL (ref 0.1–1.0)
Monocytes Relative: 6 % (ref 3–12)
Neutro Abs: 4.7 10*3/uL (ref 1.7–7.7)
Neutrophils Relative %: 55 % (ref 43–77)
PLATELETS: 272 10*3/uL (ref 150–400)
RBC: 4.41 MIL/uL (ref 3.87–5.11)
RDW: 15.2 % (ref 11.5–15.5)
WBC: 8.5 10*3/uL (ref 4.0–10.5)

## 2015-03-18 LAB — GRAM STAIN

## 2015-03-18 LAB — COMPREHENSIVE METABOLIC PANEL
ALT: 21 U/L (ref 14–54)
ANION GAP: 11 (ref 5–15)
AST: 18 U/L (ref 15–41)
Albumin: 4 g/dL (ref 3.5–5.0)
Alkaline Phosphatase: 43 U/L (ref 38–126)
BUN: 13 mg/dL (ref 6–20)
CO2: 25 mmol/L (ref 22–32)
Calcium: 10 mg/dL (ref 8.9–10.3)
Chloride: 100 mmol/L — ABNORMAL LOW (ref 101–111)
Creatinine, Ser: 0.91 mg/dL (ref 0.44–1.00)
GFR calc Af Amer: >60 mL/min (ref >60)
Glucose, Bld: 131 mg/dL — ABNORMAL HIGH (ref 65–99)
Potassium: 3.8 mmol/L (ref 3.5–5.1)
Sodium: 136 mmol/L (ref 135–145)
Total Bilirubin: 0.6 mg/dL (ref 0.3–1.2)
Total Protein: 7.5 g/dL (ref 6.5–8.1)

## 2015-03-18 LAB — URINE MICROSCOPIC-ADD ON

## 2015-03-18 LAB — URINALYSIS, ROUTINE W REFLEX MICROSCOPIC
Bilirubin Urine: NEGATIVE
Ketones, ur: NEGATIVE mg/dL
Leukocytes, UA: NEGATIVE
NITRITE: NEGATIVE
PROTEIN: NEGATIVE mg/dL
SPECIFIC GRAVITY, URINE: 1.025 (ref 1.005–1.030)
Urobilinogen, UA: 0.2 mg/dL (ref 0.0–1.0)
pH: 6 (ref 5.0–8.0)

## 2015-03-18 LAB — LIPASE, BLOOD: LIPASE: 23 U/L (ref 22–51)

## 2015-03-18 LAB — CBG MONITORING, ED: Glucose-Capillary: 129 mg/dL — ABNORMAL HIGH (ref 65–99)

## 2015-03-18 MED ORDER — IBUPROFEN 400 MG PO TABS
600.0000 mg | ORAL_TABLET | Freq: Once | ORAL | Status: AC
  Administered 2015-03-18: 600 mg via ORAL
  Filled 2015-03-18 (×2): qty 1

## 2015-03-18 MED ORDER — NAPROXEN 375 MG PO TABS: 375.0000 mg | ORAL_TABLET | Freq: Two times a day (BID) | ORAL | Status: AC | PRN

## 2015-03-18 NOTE — ED Provider Notes (Signed)
CSN: [id number]     Arrival date & time 03/18/15  1627 History   First MD Initiated Contact with Patient 03/18/15 1718     Chief Complaint  Patient presents with  . Abdominal Pain  . Generalized Body Aches     (Consider location/radiation/quality/duration/timing/severity/associated sxs/prior Treatment) HPI Comments: 39 year old female history of diabetes comes in with chief complaint of body aching, suprapubic discomfort and some lightheadedness for several days. Denies any fevers chest pain shortness breath. Had no recent change to health. No treatments attempted so far. No aggravating or alleviating factors noted  Patient is a 39 y.o. female presenting with abdominal pain. The history is provided by the patient.  Abdominal Pain Pain location:  Suprapubic Pain quality: aching   Pain radiates to:  Does not radiate Pain severity:  Moderate Onset quality:  Gradual Duration:  2 days Timing:  Intermittent Progression:  Waxing and waning Chronicity:  New Associated symptoms: no chest pain, no dysuria, no fever and no shortness of breath     Past Medical History  Diagnosis Date  . Hypertension   . Diabetes mellitus without complication    Past Surgical History  Procedure Laterality Date  . Myomectomy    . Vaginal hysterectomy     No family history on file. History  Substance Use Topics  . Smoking status: Former Research scientist (life sciences)  . Smokeless tobacco: Not on file  . Alcohol Use: Yes     Comment: occassional   OB History    Gravida Para Term Preterm AB TAB SAB Ectopic Multiple Living   3 3 3       3      Review of Systems  Constitutional: Negative for fever.  HENT: Negative for congestion.   Respiratory: Negative for shortness of breath.   Cardiovascular: Negative for chest pain.  Gastrointestinal: Positive for abdominal pain.  Genitourinary: Negative for dysuria.  Musculoskeletal: Negative for arthralgias.  Skin: Negative for rash.  Neurological: Negative for headaches.   Psychiatric/Behavioral: Negative for confusion.  All other systems reviewed and are negative.     Allergies  Orange fruit and Tomato  Home Medications   Prior to Admission medications   Medication Sig Start Date End Date Taking? Authorizing Provider  amLODipine (NORVASC) 5 MG tablet Take 5 mg by mouth daily. 02/11/14  Yes Historical Provider, MD  Cholecalciferol (VITAMIN D PO) Take 1 tablet by mouth daily.   Yes Historical Provider, MD  hydrochlorothiazide (HYDRODIURIL) 25 MG tablet Take 1 tablet (25 mg total) by mouth daily. 11/19/14  Yes Lavonia Drafts, MD  Linagliptin-Metformin HCl (JENTADUETO) 2.5-850 MG TABS Take 1 tablet by mouth 2 (two) times daily.   Yes Historical Provider, MD  rosuvastatin (CRESTOR) 10 MG tablet Take 10 mg by mouth daily.   Yes Historical Provider, MD  naproxen (NAPROSYN) 375 MG tablet Take 1 tablet (375 mg total) by mouth 2 (two) times daily as needed. 03/18/15   Robynn Pane, MD   BP 145/96 mmHg  Pulse 90  Temp(Src) 98.9 F (37.2 C) (Oral)  Resp 18  Ht 5\' 7"  (1.702 m)  Wt 230 lb (104.327 kg)  BMI 36.01 kg/m2  SpO2 100% Physical Exam  Constitutional: She is oriented to person, place, and time. She appears well-developed and well-nourished.  HENT:  Head: Normocephalic.  Eyes: Pupils are equal, round, and reactive to light.  Neck: Normal range of motion.  Cardiovascular: Normal rate and intact distal pulses.   Pulmonary/Chest: Effort normal. No respiratory distress.  Abdominal: Soft. There is tenderness.  Subjective tenderness palpation of the suprapubic area however very mild. Remainder of abdominal exam is completely benign  Genitourinary:  Deferred, patient denies any vaginal discharge, vaginal bleeding. Status [name] hysterectomy.  Musculoskeletal: She exhibits no tenderness.  Neurological: She is alert and oriented to person, place, and time. No cranial nerve deficit. She exhibits normal muscle tone.  Skin: Skin is warm.  Psychiatric: She  has a normal mood and affect.  Vitals reviewed.   ED Course  Procedures (including critical care time) Labs Review Labs Reviewed  COMPREHENSIVE METABOLIC PANEL - Abnormal; Notable for the following:    Chloride 100 (*)    Glucose, Bld 131 (*)    All other components within normal limits  URINALYSIS, ROUTINE W REFLEX MICROSCOPIC - Abnormal; Notable for the following:    Glucose, UA >1000 (*)    Hgb urine dipstick TRACE (*)    All other components within normal limits  URINE MICROSCOPIC-ADD ON - Abnormal; Notable for the following:    Casts GRANULAR CAST (*)    All other components within normal limits  CBG MONITORING, ED - Abnormal; Notable for the following:    Glucose-Capillary 129 (*)    All other components within normal limits  GRAM STAIN  CBC WITH DIFFERENTIAL/PLATELET  LIPASE, BLOOD    Imaging Review No results found.   EKG Interpretation   Date/Time:  Monday Mar 18 2015 17:00:03 EDT Ventricular Rate:  83 PR Interval:  148 QRS Duration: 86 QT Interval:  360 QTC Calculation: 423 R Axis:   72 Text Interpretation:  Normal sinus rhythm Normal ECG Confirmed by BEATON   MD, ROBERT ([id number]) on 03/18/2015 7:43:48 PM      MDM  39 year old female with vague complaints of arthralgias slight lightheadedness and some suprapubic discomfort for 1 day. On exam she is completely benign abdominal exam without rebound guarding or peritonitis. She is status Debra Mccormick hysterectomy. She denies any vaginal bleeding vaginal discharge. She is deferred a pelvic. Basic labs obtained including CBC BMP and urine. CBC BMP unremarkable. Urine does not appear grossly infected. I initially patient has no dysuria, frequency or other. Examined labs not consistent with mesenteric ischemia appendicitis, gallbladder pathology, aortic pathology or other serious. Patient likely with a viral illness causing arthralgias and lightheadedness. Patient's lightheadedness is minor. She has a normal neurological exam  without focal deficits. She is able to ambulate and tolerate by mouth. No concerning findings on exam. Recommend NSAIDs and rest. Follow up PCP as needed.   Final diagnoses:  Abdominal pain, unspecified abdominal location  Viral illness        Robynn Pane, MD 03/18/15 2423  Leonard Schwartz, MD 03/22/15 [phone]

## 2015-03-18 NOTE — Discharge Instructions (Signed)

## 2015-03-18 NOTE — ED Notes (Signed)
Pt c/o body aches and lower abdominal pain since Sat and dizziness x 1 hour.  States she feels she's coming down with something.  Denies nausea, changes in bowel or bladder habits or vaginal discharge.

## 2015-05-21 ENCOUNTER — Encounter (HOSPITAL_COMMUNITY): Payer: Self-pay | Admitting: *Deleted

## 2015-05-21 ENCOUNTER — Emergency Department (HOSPITAL_COMMUNITY)
Admission: EM | Admit: 2015-05-21 | Discharge: 2015-05-21 | Disposition: A | Discharge status: Home / Self Care | Payer: BLUE CROSS/BLUE SHIELD | Attending: Emergency Medicine | Admitting: Emergency Medicine

## 2015-05-21 DIAGNOSIS — M545 Low back pain: Secondary | ICD-10-CM | POA: Diagnosis not present

## 2015-05-21 DIAGNOSIS — E119 Type 2 diabetes mellitus without complications: Secondary | ICD-10-CM | POA: Diagnosis not present

## 2015-05-21 DIAGNOSIS — Z791 Long term (current) use of non-steroidal anti-inflammatories (NSAID): Secondary | ICD-10-CM | POA: Insufficient documentation

## 2015-05-21 DIAGNOSIS — B373 Candidiasis of vulva and vagina: Secondary | ICD-10-CM | POA: Insufficient documentation

## 2015-05-21 DIAGNOSIS — Z79899 Other long term (current) drug therapy: Secondary | ICD-10-CM | POA: Insufficient documentation

## 2015-05-21 DIAGNOSIS — Z87891 Personal history of nicotine dependence: Secondary | ICD-10-CM | POA: Insufficient documentation

## 2015-05-21 DIAGNOSIS — I10 Essential (primary) hypertension: Secondary | ICD-10-CM | POA: Diagnosis not present

## 2015-05-21 DIAGNOSIS — R109 Unspecified abdominal pain: Secondary | ICD-10-CM | POA: Diagnosis present

## 2015-05-21 DIAGNOSIS — B3731 Acute candidiasis of vulva and vagina: Secondary | ICD-10-CM

## 2015-05-21 LAB — URINE MICROSCOPIC-ADD ON

## 2015-05-21 LAB — URINALYSIS, ROUTINE W REFLEX MICROSCOPIC
BILIRUBIN URINE: NEGATIVE
Hgb urine dipstick: NEGATIVE
Ketones, ur: NEGATIVE mg/dL
LEUKOCYTES UA: NEGATIVE
NITRITE: NEGATIVE
Protein, ur: NEGATIVE mg/dL
Specific Gravity, Urine: 1.029 (ref 1.005–1.030)
Urobilinogen, UA: 0.2 mg/dL (ref 0.0–1.0)
pH: 6.5 (ref 5.0–8.0)

## 2015-05-21 MED ORDER — FLUCONAZOLE 150 MG PO TABS: 150.0000 mg | ORAL_TABLET | Freq: Every day | ORAL | Status: DC

## 2015-05-21 NOTE — ED Provider Notes (Signed)
CSN: [id number]     Arrival date & time 05/21/15  1500 History   This chart was scribed for Alyse Low, PA-C working with Daleen Bo, MD by Mercy Moore, ED Scribe. This patient was seen in room TR01C/TR01C and the patient's care was started at 7:11 PM.   Chief Complaint  Patient presents with  . Flank Pain   The history is provided by the patient. No language interpreter was used.   HPI Comments: Debra Mccormick is a 39 y.o. female with PMHx of Hypertension and Diabetes Mellitus who presents to the Emergency Department complaining of an array of symptoms including lower back pain, dysuria and vaginal discharge for two days now. Patient is not concerned about STDs. Patient reports frequent history of yeast infections; twice this month. Patient is confident that her symptoms are consistent with prior yeast infection and suspects this is associated with her Diabetes.  PCP: Dr. Criss Rosales   Past Medical History  Diagnosis Date  . Hypertension   . Diabetes mellitus without complication    Past Surgical History  Procedure Laterality Date  . Myomectomy    . Vaginal hysterectomy     No family history on file. History  Substance Use Topics  . Smoking status: Former Research scientist (life sciences)  . Smokeless tobacco: Not on file  . Alcohol Use: Yes     Comment: occassional   OB History    Gravida Para Term Preterm AB TAB SAB Ectopic Multiple Living   3 3 3       3      Review of Systems  Constitutional: Negative for fever.  Gastrointestinal: Negative for abdominal pain.  Genitourinary: Positive for dysuria and vaginal discharge.  Musculoskeletal: Positive for back pain.  All other systems reviewed and are negative.   Allergies  Orange fruit and Tomato  Home Medications   Prior to Admission medications   Medication Sig Start Date End Date Taking? Authorizing Provider  amLODipine (NORVASC) 5 MG tablet Take 5 mg by mouth daily. 02/11/14   Historical Provider, MD  Cholecalciferol (VITAMIN D PO) Take 1  tablet by mouth daily.    Historical Provider, MD  hydrochlorothiazide (HYDRODIURIL) 25 MG tablet Take 1 tablet (25 mg total) by mouth daily. 11/19/14   Lavonia Drafts, MD  Linagliptin-Metformin HCl (JENTADUETO) 2.5-850 MG TABS Take 1 tablet by mouth 2 (two) times daily.    Historical Provider, MD  naproxen (NAPROSYN) 375 MG tablet Take 1 tablet (375 mg total) by mouth 2 (two) times daily as needed. 03/18/15   Robynn Pane, MD  rosuvastatin (CRESTOR) 10 MG tablet Take 10 mg by mouth daily.    Historical Provider, MD   Triage Vitals: BP 184/105 mmHg  Pulse 103  Temp(Src) 98.5 F (36.9 C) (Oral)  Resp 20  SpO2 94% Physical Exam  Constitutional: She is oriented to person, place, and time. She appears well-developed and well-nourished. No distress.  HENT:  Head: Normocephalic and atraumatic.  Eyes: EOM are normal.  Neck: Neck supple. No tracheal deviation present.  Cardiovascular: Normal rate.   Pulmonary/Chest: Effort normal. No respiratory distress.  Musculoskeletal: Normal range of motion.  Neurological: She is alert and oriented to person, place, and time.  Skin: Skin is warm and dry.  Psychiatric: She has a normal mood and affect. Her behavior is normal.  Nursing note and vitals reviewed.   ED Course  Procedures (including critical care time)  COORDINATION OF CARE: 7:13 PM- Discussed treatment plan with patient at bedside and patient agreed to plan.  Labs Review Labs Reviewed  URINALYSIS, ROUTINE W REFLEX MICROSCOPIC (NOT AT [facility]) - Abnormal; Notable for the following:    Glucose, UA >1000 (*)    All other components within normal limits  URINE MICROSCOPIC-ADD ON - Abnormal; Notable for the following:    Squamous Epithelial / LPF FEW (*)    All other components within normal limits    Imaging Review No results found.   EKG Interpretation None      MDM pt declined pelvic.   Pt concerned about uti.   Pt does want treatment for yeast    Final diagnoses:   Yeast vaginitis     Diflucan avs  Fransico Meadow, PA-C 05/22/15 0032  Daleen Bo, MD 05/24/15 [phone]

## 2015-05-21 NOTE — ED Notes (Signed)
Pt states that she has been experiencing flank back, pain with urination and white vaginal discharge x2 days.

## 2015-05-21 NOTE — Discharge Instructions (Signed)

## 2015-05-23 ENCOUNTER — Emergency Department (HOSPITAL_COMMUNITY)
Admission: EM | Admit: 2015-05-23 | Discharge: 2015-05-23 | Disposition: A | Discharge status: Home / Self Care | Payer: BLUE CROSS/BLUE SHIELD | Attending: Emergency Medicine | Admitting: Emergency Medicine

## 2015-05-23 ENCOUNTER — Encounter (HOSPITAL_COMMUNITY): Payer: Self-pay

## 2015-05-23 DIAGNOSIS — R42 Dizziness and giddiness: Secondary | ICD-10-CM | POA: Diagnosis not present

## 2015-05-23 DIAGNOSIS — R51 Headache: Secondary | ICD-10-CM | POA: Insufficient documentation

## 2015-05-23 DIAGNOSIS — Z87891 Personal history of nicotine dependence: Secondary | ICD-10-CM | POA: Diagnosis not present

## 2015-05-23 DIAGNOSIS — E119 Type 2 diabetes mellitus without complications: Secondary | ICD-10-CM | POA: Insufficient documentation

## 2015-05-23 DIAGNOSIS — Z79899 Other long term (current) drug therapy: Secondary | ICD-10-CM | POA: Diagnosis not present

## 2015-05-23 DIAGNOSIS — R519 Headache, unspecified: Secondary | ICD-10-CM

## 2015-05-23 DIAGNOSIS — I10 Essential (primary) hypertension: Secondary | ICD-10-CM | POA: Insufficient documentation

## 2015-05-23 LAB — CBC WITH DIFFERENTIAL/PLATELET
Basophils Absolute: 0 10*3/uL (ref 0.0–0.1)
Basophils Relative: 0 % (ref 0–1)
EOS PCT: 2 % (ref 0–5)
Eosinophils Absolute: 0.1 10*3/uL (ref 0.0–0.7)
HCT: 38.5 % (ref 36.0–46.0)
HEMOGLOBIN: 12.7 g/dL (ref 12.0–15.0)
Lymphocytes Relative: 35 % (ref 12–46)
Lymphs Abs: 2.6 10*3/uL (ref 0.7–4.0)
MCH: 27.7 pg (ref 26.0–34.0)
MCHC: 33 g/dL (ref 30.0–36.0)
MCV: 83.9 fL (ref 78.0–100.0)
Monocytes Absolute: 0.5 10*3/uL (ref 0.1–1.0)
Monocytes Relative: 7 % (ref 3–12)
NEUTROS PCT: 56 % (ref 43–77)
Neutro Abs: 4.2 10*3/uL (ref 1.7–7.7)
Platelets: 272 10*3/uL (ref 150–400)
RBC: 4.59 MIL/uL (ref 3.87–5.11)
RDW: 16 % — AB (ref 11.5–15.5)
WBC: 7.4 10*3/uL (ref 4.0–10.5)

## 2015-05-23 LAB — COMPREHENSIVE METABOLIC PANEL
ALBUMIN: 4 g/dL (ref 3.5–5.0)
ALK PHOS: 41 U/L (ref 38–126)
ALT: 26 U/L (ref 14–54)
AST: 25 U/L (ref 15–41)
Anion gap: 6 (ref 5–15)
BUN: 14 mg/dL (ref 6–20)
CALCIUM: 10.2 mg/dL (ref 8.9–10.3)
CO2: 27 mmol/L (ref 22–32)
Chloride: 105 mmol/L (ref 101–111)
Creatinine, Ser: 0.75 mg/dL (ref 0.44–1.00)
GLUCOSE: 173 mg/dL — AB (ref 65–99)
Potassium: 3.9 mmol/L (ref 3.5–5.1)
Sodium: 138 mmol/L (ref 135–145)
Total Bilirubin: 0.7 mg/dL (ref 0.3–1.2)
Total Protein: 7.5 g/dL (ref 6.5–8.1)

## 2015-05-23 MED ORDER — OXYCODONE-ACETAMINOPHEN 5-325 MG PO TABS
ORAL_TABLET | ORAL | Status: DC
Start: 2015-05-23 — End: 2015-05-23
  Filled 2015-05-23: qty 1

## 2015-05-23 MED ORDER — BUTALBITAL-APAP-CAFFEINE 50-325-40 MG PO TABS: 1.0000 | ORAL_TABLET | Freq: Four times a day (QID) | ORAL | Status: AC | PRN

## 2015-05-23 MED ORDER — OXYCODONE-ACETAMINOPHEN 5-325 MG PO TABS
1.0000 | ORAL_TABLET | Freq: Once | ORAL | Status: AC
  Administered 2015-05-23: 1 via ORAL

## 2015-05-23 NOTE — ED Provider Notes (Signed)
CSN: [id number]     Arrival date & time 05/23/15  1334 History   First MD Initiated Contact with Patient 05/23/15 1629     CC: HA  HPI Pt developed a headache when she woke up this am.  She tried ibuprofen without relief.  At work her BP was elevated in the 170s.  She felt lightheaded.She came to the ED.  The bifrontal.  Throbbing headache.  No sudden onset.  No fever.  No neck pain.  Past Medical History  Diagnosis Date  . Hypertension   . Diabetes mellitus without complication    Past Surgical History  Procedure Laterality Date  . Myomectomy    . Vaginal hysterectomy     History reviewed. No pertinent family history. History  Substance Use Topics  . Smoking status: Former Research scientist (life sciences)  . Smokeless tobacco: Not on file  . Alcohol Use: Yes     Comment: occassional   OB History    Gravida Para Term Preterm AB TAB SAB Ectopic Multiple Living   3 3 3       3      Review of Systems  Constitutional: Negative for fever and fatigue.  Respiratory: Negative for shortness of breath.   Cardiovascular: Negative for chest pain.  Genitourinary: Negative for dysuria.  Neurological: Negative for speech difficulty and numbness.      Allergies  Orange fruit and Tomato  Home Medications   Prior to Admission medications   Medication Sig Start Date End Date Taking? Authorizing Provider  amLODipine (NORVASC) 5 MG tablet Take 5 mg by mouth daily. 02/11/14  Yes Historical Provider, MD  Cholecalciferol (VITAMIN D PO) Take 1 tablet by mouth daily.   Yes Historical Provider, MD  fluconazole (DIFLUCAN) 150 MG tablet Take 1 tablet (150 mg total) by mouth daily. 05/21/15  Yes Hollace Kinnier Sofia, PA-C  hydrochlorothiazide (HYDRODIURIL) 25 MG tablet Take 1 tablet (25 mg total) by mouth daily. 11/19/14  Yes Lavonia Drafts, MD  Linagliptin-Metformin HCl (JENTADUETO) 2.5-850 MG TABS Take 1 tablet by mouth 2 (two) times daily.   Yes Historical Provider, MD  naproxen (NAPROSYN) 375 MG tablet Take 1 tablet  (375 mg total) by mouth 2 (two) times daily as needed. 03/18/15  Yes Robynn Pane, MD  rosuvastatin (CRESTOR) 10 MG tablet Take 10 mg by mouth daily.   Yes Historical Provider, MD  butalbital-acetaminophen-caffeine (FIORICET) 50-325-40 MG per tablet Take 1 tablet by mouth every 6 (six) hours as needed for headache. 05/23/15 05/22/16  Dorie Rank, MD   BP 135/90 mmHg  Pulse 85  Temp(Src) 98.5 F (36.9 C) (Oral)  Resp 16  Ht 5\' 7"  (1.702 m)  Wt 230 lb (104.327 kg)  BMI 36.01 kg/m2  SpO2 97% Physical Exam  Constitutional: She appears well-developed and well-nourished. No distress.  HENT:  Head: Normocephalic and atraumatic.  Right Ear: External ear normal.  Left Ear: External ear normal.  Eyes: Conjunctivae are normal. Right eye exhibits no discharge. Left eye exhibits no discharge. No scleral icterus.  Neck: Neck supple. No tracheal deviation present.  Cardiovascular: Normal rate, regular rhythm and intact distal pulses.   Pulmonary/Chest: Effort normal and breath sounds normal. No stridor. No respiratory distress. She has no wheezes. She has no rales.  Abdominal: Soft. Bowel sounds are normal. She exhibits no distension. There is no tenderness. There is no rebound and no guarding.  Musculoskeletal: She exhibits no edema or tenderness.  Neurological: She is alert. She has normal strength. No cranial nerve deficit (no facial  droop, extraocular movements intact, no slurred speech) or sensory deficit. She exhibits normal muscle tone. She displays no seizure activity. Coordination normal.  Skin: Skin is warm and dry. No rash noted.  Psychiatric: She has a normal mood and affect.  Nursing note and vitals reviewed.   ED Course  Procedures (including critical care time) Labs Review Labs Reviewed  CBC WITH DIFFERENTIAL/PLATELET - Abnormal; Notable for the following:    RDW 16.0 (*)    All other components within normal limits  COMPREHENSIVE METABOLIC PANEL - Abnormal; Notable for the following:     Glucose, Bld 173 (*)    All other components within normal limits     MDM   Final diagnoses:  Essential hypertension  Acute nonintractable headache, unspecified headache type   Doubt SAH, meningitis, ICH.  Sx have improved.  BP improved with treatment.  May have been related to the ibuprofen.  Will have her monitor BP. Follow up with PCP  At this time there does not appear to be any evidence of an acute emergency medical condition and the patient appears stable for discharge with appropriate outpatient follow up.     Dorie Rank, MD 05/23/15 [phone]

## 2015-05-23 NOTE — Discharge Instructions (Signed)

## 2015-05-23 NOTE — ED Notes (Signed)
Pt presents with onset of frontal headache that began this morning.  Pt reports becoming "woozy" while at work, had BP taken: 170/103; pt reports compliance with BP medication; doses has not changed.

## 2015-07-08 ENCOUNTER — Other Ambulatory Visit: Payer: Self-pay | Admitting: Obstetrics & Gynecology

## 2015-07-08 DIAGNOSIS — N6002 Solitary cyst of left breast: Secondary | ICD-10-CM

## 2015-08-09 ENCOUNTER — Ambulatory Visit
Admission: RE | Admit: 2015-08-09 | Discharge: 2015-08-09 | Disposition: A | Discharge status: Home / Self Care | Payer: BLUE CROSS/BLUE SHIELD | Source: Ambulatory Visit | Attending: Obstetrics & Gynecology | Admitting: Obstetrics & Gynecology

## 2015-08-09 DIAGNOSIS — N6002 Solitary cyst of left breast: Secondary | ICD-10-CM

## 2015-08-20 ENCOUNTER — Telehealth: Payer: Self-pay | Admitting: General Practice

## 2015-08-20 DIAGNOSIS — N6002 Solitary cyst of left breast: Secondary | ICD-10-CM

## 2015-08-20 NOTE — Telephone Encounter (Signed)
Per Dr Harolyn Rutherford, patient needs f/u left breast u/s and diagnostic mammogram in 6 months. Appt scheduled for 01/29/16 @ 8am. Called patient, no answer- left message stating we are trying to reach you with information about an appt, please call us back at the clinics

## 2015-08-21 ENCOUNTER — Telehealth: Payer: Self-pay | Admitting: *Deleted

## 2015-08-21 NOTE — Telephone Encounter (Signed)
Received message left on nurse line on 08/21/15 at 0915.  Patient states she is returning our call.  Requests a return call.

## 2015-08-21 NOTE — Telephone Encounter (Signed)
Spoke with patient. Already handle

## 2015-08-21 NOTE — Telephone Encounter (Signed)
Pt informed of mammogram scheduled.

## 2015-11-12 ENCOUNTER — Encounter (HOSPITAL_COMMUNITY): Payer: Self-pay | Admitting: Adult Health

## 2015-11-12 ENCOUNTER — Emergency Department (HOSPITAL_COMMUNITY)
Admission: EM | Admit: 2015-11-12 | Discharge: 2015-11-12 | Disposition: A | Discharge status: Home / Self Care | Payer: BLUE CROSS/BLUE SHIELD | Attending: Emergency Medicine | Admitting: Emergency Medicine

## 2015-11-12 DIAGNOSIS — E119 Type 2 diabetes mellitus without complications: Secondary | ICD-10-CM | POA: Diagnosis not present

## 2015-11-12 DIAGNOSIS — I10 Essential (primary) hypertension: Secondary | ICD-10-CM | POA: Diagnosis present

## 2015-11-12 DIAGNOSIS — Z79899 Other long term (current) drug therapy: Secondary | ICD-10-CM | POA: Insufficient documentation

## 2015-11-12 DIAGNOSIS — Z87891 Personal history of nicotine dependence: Secondary | ICD-10-CM | POA: Diagnosis not present

## 2015-11-12 DIAGNOSIS — I159 Secondary hypertension, unspecified: Secondary | ICD-10-CM | POA: Diagnosis not present

## 2015-11-12 DIAGNOSIS — R42 Dizziness and giddiness: Secondary | ICD-10-CM | POA: Diagnosis not present

## 2015-11-12 MED ORDER — LISINOPRIL 20 MG PO TABS
20.0000 mg | ORAL_TABLET | Freq: Once | ORAL | Status: AC
  Administered 2015-11-12: 20 mg via ORAL
  Filled 2015-11-12: qty 1

## 2015-11-12 MED ORDER — AMLODIPINE BESYLATE 5 MG PO TABS
5.0000 mg | ORAL_TABLET | Freq: Once | ORAL | Status: AC
  Administered 2015-11-12: 5 mg via ORAL
  Filled 2015-11-12: qty 1

## 2015-11-12 MED ORDER — LISINOPRIL 20 MG PO TABS: 20.0000 mg | ORAL_TABLET | Freq: Once | ORAL | Status: AC

## 2015-11-12 MED ORDER — AMLODIPINE BESYLATE 5 MG PO TABS: 5.0000 mg | ORAL_TABLET | Freq: Once | ORAL | Status: AC

## 2015-11-12 NOTE — ED Notes (Signed)
Presents with complaints of hypertension, upon waking this am pt reports seeing star and feeling dizzy-went into work and BP was 210/115-has been out of BP medication for 2 days-lisinopril and amlodipine-has appointment with MD tomorrow to get refilled. BP at this time 184/106, endorses headache and dizziness. Denies blurred vision, numbness, tingling or weakness.

## 2015-11-12 NOTE — ED Provider Notes (Signed)
CSN: [id number]     Arrival date & time 11/12/15  [id number] History   First MD Initiated Contact with Patient 11/12/15 [phone]     Chief Complaint  Patient presents with  . Hypertension     (Consider location/radiation/quality/duration/timing/severity/associated sxs/prior Treatment) Patient is a 40 y.o. female presenting with hypertension.  Hypertension This is a chronic problem. The current episode started more than 1 week ago. The problem occurs constantly. The problem has been gradually worsening. Pertinent negatives include no chest pain, no abdominal pain, no headaches and no shortness of breath. Nothing aggravates the symptoms. Nothing relieves the symptoms. The treatment provided no relief.    Past Medical History  Diagnosis Date  . Hypertension   . Diabetes mellitus without complication [facility])    Past Surgical History  Procedure Laterality Date  . Myomectomy    . Vaginal hysterectomy     History reviewed. No pertinent family history. Social History  Substance Use Topics  . Smoking status: Former Research scientist (life sciences)  . Smokeless tobacco: None  . Alcohol Use: Yes     Comment: occassional   OB History    Gravida Para Term Preterm AB TAB SAB Ectopic Multiple Living   3 3 3       3      Review of Systems  Constitutional: Negative for fever and chills.  HENT: Negative for congestion and drooling.   Eyes: Negative for pain.  Respiratory: Negative for shortness of breath.   Cardiovascular: Negative for chest pain and leg swelling.  Gastrointestinal: Negative for nausea and abdominal pain.  Endocrine: Negative for polydipsia and polyuria.  Genitourinary: Negative for dysuria.  Neurological: Positive for dizziness. Negative for syncope, speech difficulty, weakness and headaches.  All other systems reviewed and are negative.     Allergies  Orange fruit and Tomato  Home Medications   Prior to Admission medications   Medication Sig Start Date End Date Taking? Authorizing Provider   lisinopril (PRINIVIL,ZESTRIL) 20 MG tablet Take 20 mg by mouth daily.   Yes Historical Provider, MD  amLODipine (NORVASC) 5 MG tablet Take 5 mg by mouth daily. 02/11/14   Historical Provider, MD  butalbital-acetaminophen-caffeine (FIORICET) 50-325-40 MG per tablet Take 1 tablet by mouth every 6 (six) hours as needed for headache. 05/23/15 05/22/16  Dorie Rank, MD  Cholecalciferol (VITAMIN D PO) Take 1 tablet by mouth daily.    Historical Provider, MD  fluconazole (DIFLUCAN) 150 MG tablet Take 1 tablet (150 mg total) by mouth daily. 05/21/15   Fransico Meadow, PA-C  hydrochlorothiazide (HYDRODIURIL) 25 MG tablet Take 1 tablet (25 mg total) by mouth daily. 11/19/14   Lavonia Drafts, MD  Linagliptin-Metformin HCl (JENTADUETO) 2.5-850 MG TABS Take 1 tablet by mouth 2 (two) times daily.    Historical Provider, MD  naproxen (NAPROSYN) 375 MG tablet Take 1 tablet (375 mg total) by mouth 2 (two) times daily as needed. 03/18/15   Robynn Pane, MD  rosuvastatin (CRESTOR) 10 MG tablet Take 10 mg by mouth daily.    Historical Provider, MD   BP 156/108 mmHg  Pulse 86  Temp(Src) 98.5 F (36.9 C) (Oral)  Resp 18  SpO2 98% Physical Exam  Constitutional: She is oriented to person, place, and time. She appears well-developed and well-nourished.  HENT:  Head: Normocephalic and atraumatic.  Neck: Normal range of motion.  Cardiovascular: Normal rate and regular rhythm.   Pulmonary/Chest: No stridor. No respiratory distress.  Abdominal: Soft. She exhibits no distension. There is no tenderness.  Genitourinary: Guaiac negative  stool. No vaginal discharge found.  Musculoskeletal: Normal range of motion. She exhibits no edema or tenderness.  Neurological: She is alert and oriented to person, place, and time. No cranial nerve deficit.  No altered mental status, able to give full seemingly accurate history.  Face is symmetric, EOM's intact, pupils equal and reactive, vision intact, tongue and uvula midline without  deviation Upper and Lower extremity motor 5/5, intact pain perception in distal extremities, 2+ reflexes in biceps, patella and achilles tendons. Finger to nose normal, heel to shin normal. Walks without assistance or evident ataxia.  Skin: Skin is warm and dry. No rash noted. No erythema.  Nursing note and vitals reviewed.   ED Course  Procedures (including critical care time) Labs Review Labs Reviewed - No data to display  Imaging Review No results found. I have personally reviewed and evaluated these images and lab results as part of my medical decision-making.   EKG Interpretation None      MDM   Final diagnoses:  Secondary hypertension, unspecified    40 yo F here with dizziness associated with HTN after being out of emds. Had some meds here, improvement in BP and symptoms. Dizziness not resolved totally but has improved. Not consistent in nature, neuro exam unremarkable, doubt cva, no indication for CT. No indication for labs. Stable for dc. Has PCP follow up tomorrow for med refills.     Merrily Pew, MD 11/12/15 [phone]

## 2015-11-13 DIAGNOSIS — E119 Type 2 diabetes mellitus without complications: Secondary | ICD-10-CM | POA: Insufficient documentation

## 2015-11-13 DIAGNOSIS — E669 Obesity, unspecified: Secondary | ICD-10-CM | POA: Insufficient documentation

## 2015-11-14 DIAGNOSIS — E782 Mixed hyperlipidemia: Secondary | ICD-10-CM | POA: Insufficient documentation

## 2016-01-29 ENCOUNTER — Other Ambulatory Visit: Payer: BLUE CROSS/BLUE SHIELD

## 2016-02-06 DIAGNOSIS — E119 Type 2 diabetes mellitus without complications: Secondary | ICD-10-CM | POA: Diagnosis not present

## 2016-02-06 DIAGNOSIS — I10 Essential (primary) hypertension: Secondary | ICD-10-CM | POA: Diagnosis not present

## 2016-02-06 DIAGNOSIS — E782 Mixed hyperlipidemia: Secondary | ICD-10-CM | POA: Diagnosis not present

## 2016-02-12 DIAGNOSIS — Z1239 Encounter for other screening for malignant neoplasm of breast: Secondary | ICD-10-CM | POA: Diagnosis not present

## 2016-02-12 DIAGNOSIS — E782 Mixed hyperlipidemia: Secondary | ICD-10-CM | POA: Diagnosis not present

## 2016-02-12 DIAGNOSIS — E668 Other obesity: Secondary | ICD-10-CM | POA: Diagnosis not present

## 2016-02-12 DIAGNOSIS — E119 Type 2 diabetes mellitus without complications: Secondary | ICD-10-CM | POA: Diagnosis not present

## 2016-02-19 ENCOUNTER — Ambulatory Visit
Admission: RE | Admit: 2016-02-19 | Discharge: 2016-02-19 | Disposition: A | Discharge status: Home / Self Care | Payer: BLUE CROSS/BLUE SHIELD | Source: Ambulatory Visit | Attending: Obstetrics & Gynecology | Admitting: Obstetrics & Gynecology

## 2016-02-19 DIAGNOSIS — N63 Unspecified lump in breast: Secondary | ICD-10-CM | POA: Diagnosis not present

## 2016-02-19 DIAGNOSIS — N6002 Solitary cyst of left breast: Secondary | ICD-10-CM

## 2016-03-09 DIAGNOSIS — R928 Other abnormal and inconclusive findings on diagnostic imaging of breast: Secondary | ICD-10-CM | POA: Insufficient documentation

## 2016-04-08 ENCOUNTER — Emergency Department (HOSPITAL_COMMUNITY): Payer: BLUE CROSS/BLUE SHIELD

## 2016-04-08 ENCOUNTER — Emergency Department (HOSPITAL_COMMUNITY)
Admission: EM | Admit: 2016-04-08 | Discharge: 2016-04-08 | Disposition: A | Discharge status: Home / Self Care | Payer: BLUE CROSS/BLUE SHIELD | Attending: Emergency Medicine | Admitting: Emergency Medicine

## 2016-04-08 ENCOUNTER — Emergency Department (HOSPITAL_BASED_OUTPATIENT_CLINIC_OR_DEPARTMENT_OTHER)
Admit: 2016-04-08 | Discharge: 2016-04-08 | Disposition: A | Discharge status: Home / Self Care | Payer: BLUE CROSS/BLUE SHIELD | Attending: Emergency Medicine | Admitting: Emergency Medicine

## 2016-04-08 ENCOUNTER — Encounter (HOSPITAL_COMMUNITY): Payer: Self-pay | Admitting: Nurse Practitioner

## 2016-04-08 DIAGNOSIS — I10 Essential (primary) hypertension: Secondary | ICD-10-CM | POA: Diagnosis not present

## 2016-04-08 DIAGNOSIS — W010XXA Fall on same level from slipping, tripping and stumbling without subsequent striking against object, initial encounter: Secondary | ICD-10-CM | POA: Diagnosis not present

## 2016-04-08 DIAGNOSIS — M79661 Pain in right lower leg: Secondary | ICD-10-CM | POA: Diagnosis not present

## 2016-04-08 DIAGNOSIS — M79604 Pain in right leg: Secondary | ICD-10-CM | POA: Diagnosis not present

## 2016-04-08 DIAGNOSIS — Y999 Unspecified external cause status: Secondary | ICD-10-CM | POA: Insufficient documentation

## 2016-04-08 DIAGNOSIS — M546 Pain in thoracic spine: Secondary | ICD-10-CM

## 2016-04-08 DIAGNOSIS — Y929 Unspecified place or not applicable: Secondary | ICD-10-CM | POA: Diagnosis not present

## 2016-04-08 DIAGNOSIS — M79609 Pain in unspecified limb: Secondary | ICD-10-CM | POA: Diagnosis not present

## 2016-04-08 DIAGNOSIS — M7989 Other specified soft tissue disorders: Secondary | ICD-10-CM

## 2016-04-08 DIAGNOSIS — Z7984 Long term (current) use of oral hypoglycemic drugs: Secondary | ICD-10-CM | POA: Insufficient documentation

## 2016-04-08 DIAGNOSIS — Y939 Activity, unspecified: Secondary | ICD-10-CM | POA: Diagnosis not present

## 2016-04-08 DIAGNOSIS — M545 Low back pain: Secondary | ICD-10-CM | POA: Diagnosis present

## 2016-04-08 DIAGNOSIS — E119 Type 2 diabetes mellitus without complications: Secondary | ICD-10-CM | POA: Diagnosis not present

## 2016-04-08 DIAGNOSIS — Z87891 Personal history of nicotine dependence: Secondary | ICD-10-CM | POA: Insufficient documentation

## 2016-04-08 IMAGING — US US BREAST LTD UNI LEFT INC AXILLA
1 series · 13 of 17 positions shown · non-contrast
Comparison: Previous exam(s).

CLINICAL DATA: Followup left breast asymmetrical densities, cysts
and probable fibroadenoma.

EXAM:
DIGITAL DIAGNOSTIC LEFT MAMMOGRAM WITH 3D TOMOSYNTHESIS
ULTRASOUND LEFT BREAST

[Series 1: advbreast · 13 of 17 slices shown]
[im 1/17]
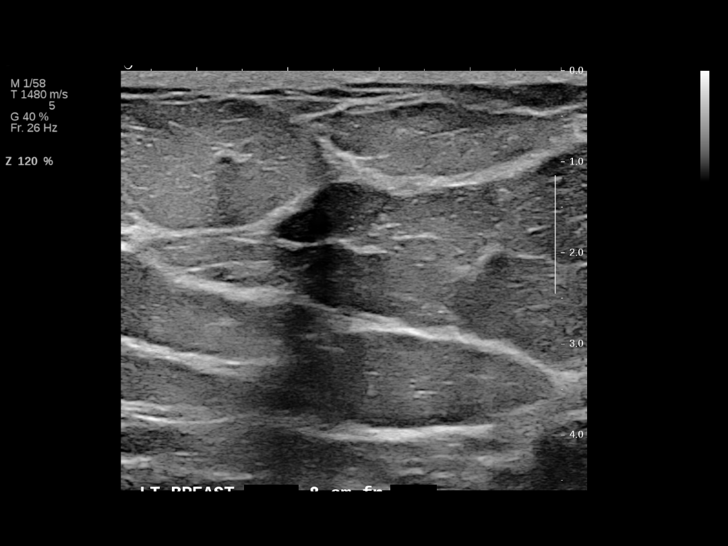
[im 2/17]
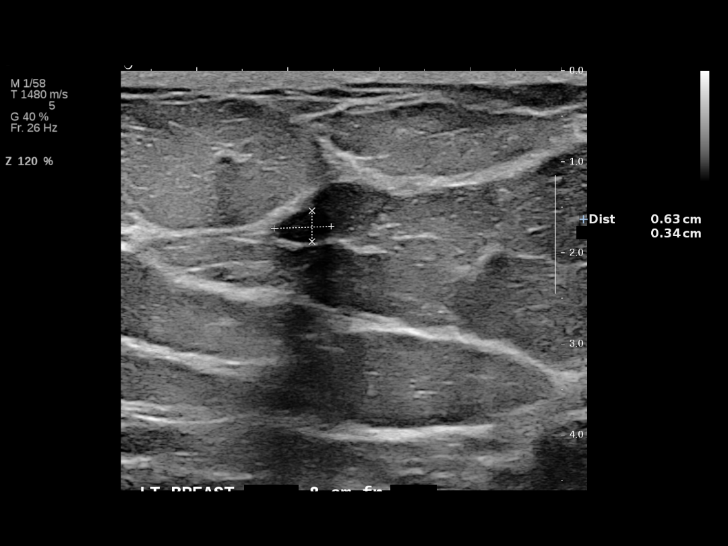
[im 4/17]
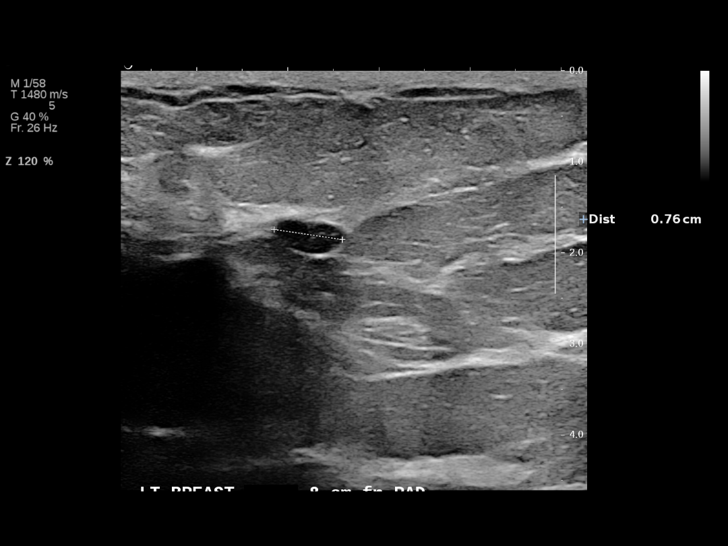
[im 5/17]
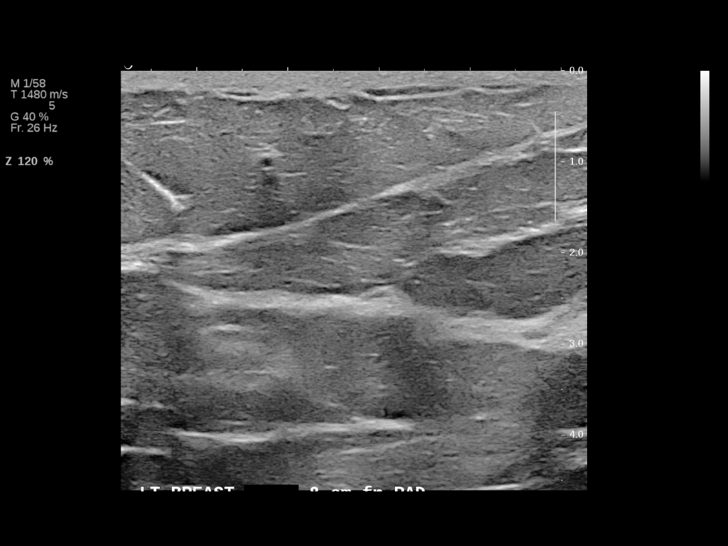
[im 6/17]
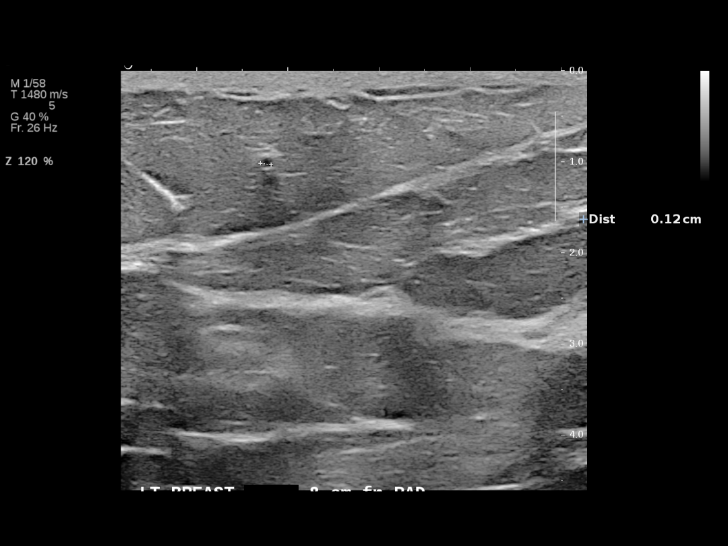
[im 8/17]
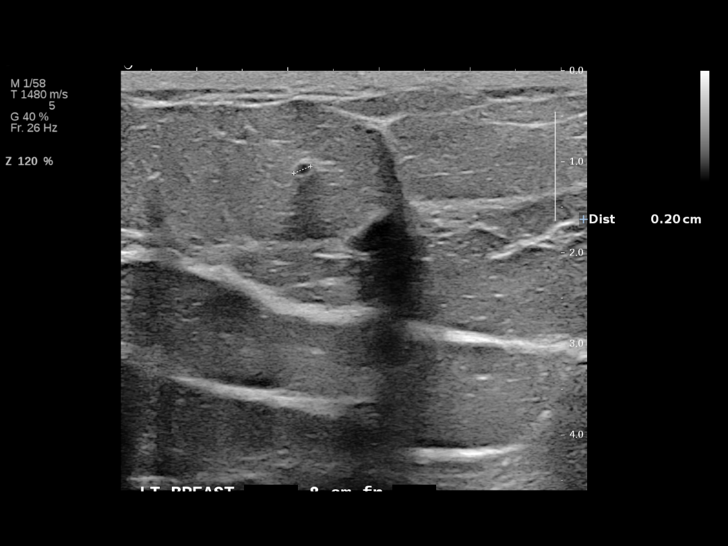
[im 9/17]
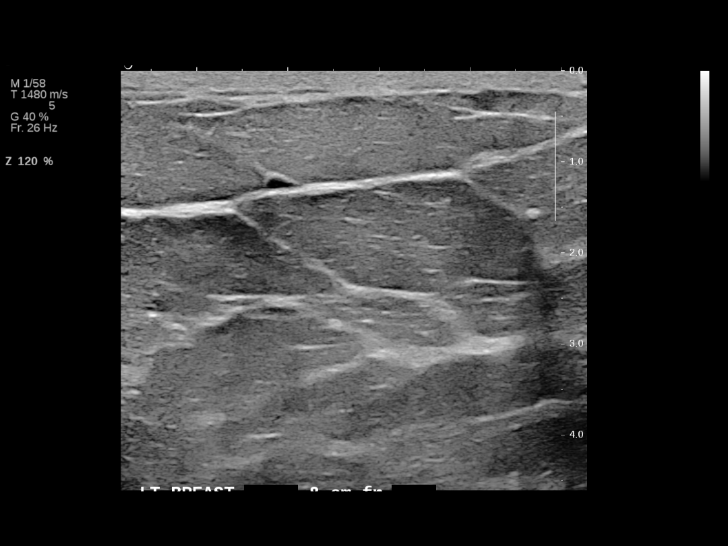
[im 10/17]
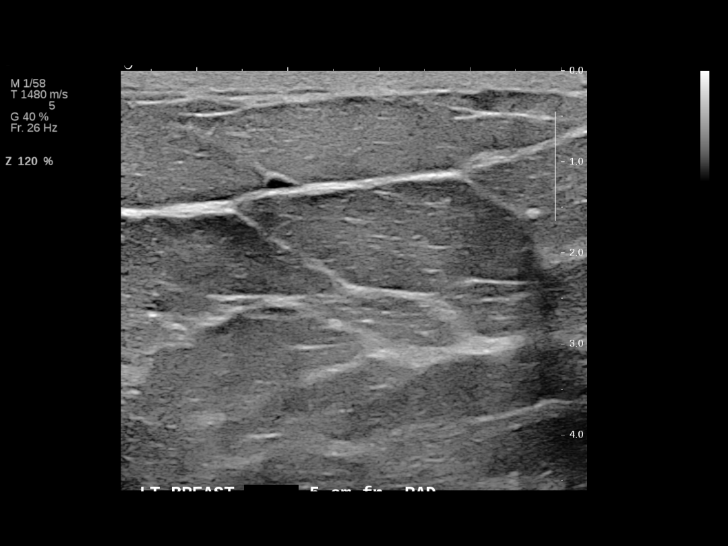
[im 12/17]
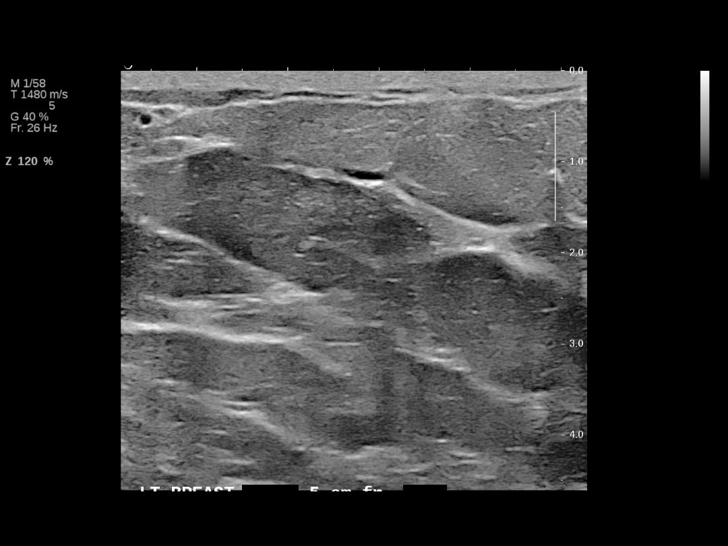
[im 13/17]
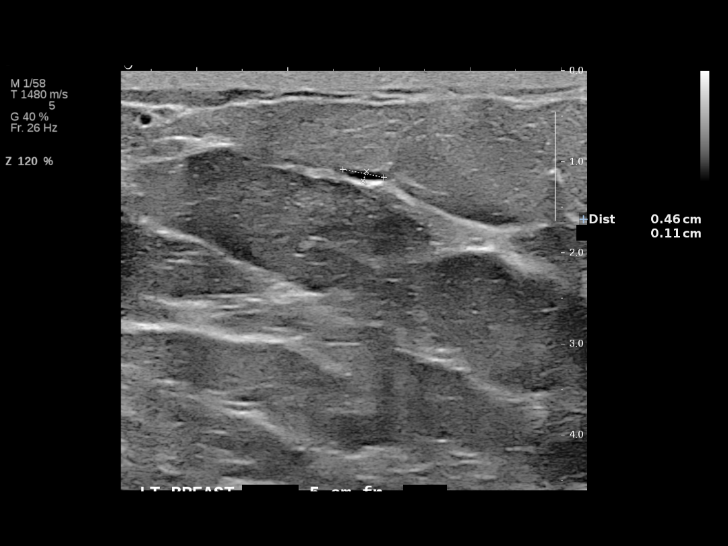
[im 14/17]
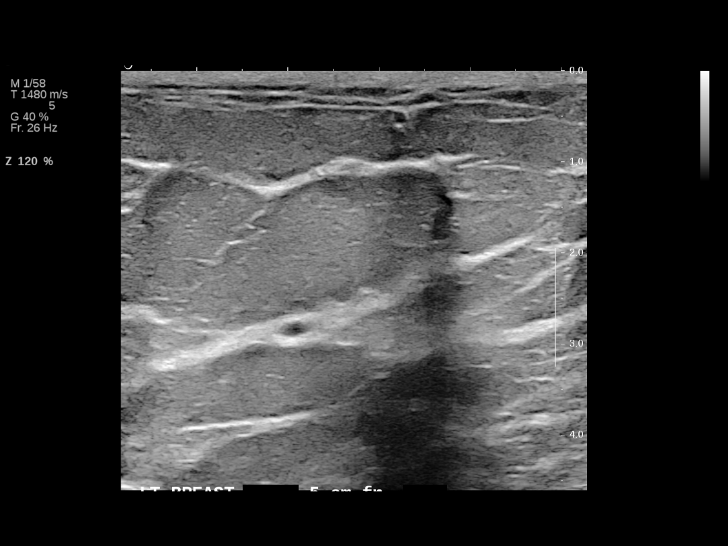
[im 16/17]
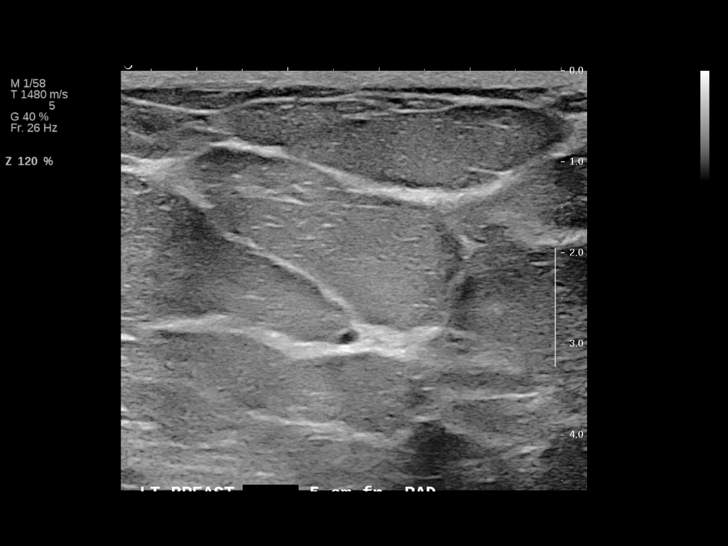
[im 17/17]
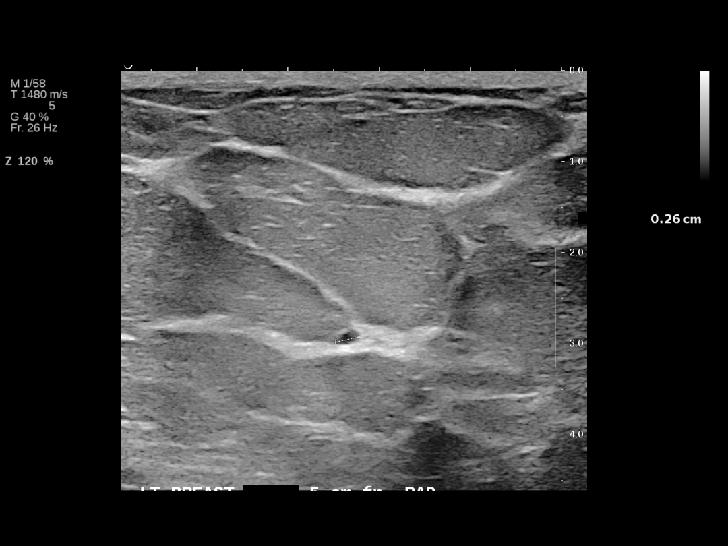

[13 of 17 positions shown; findings below may reference images not displayed]

ACR Breast Density Category b: There are scattered areas of
fibroglandular density.
FINDINGS: 3D tomographic images of the left breast demonstrate no significant
change in the asymmetrical densities seen in the upper inner and
upper are outer quadrants of the breast since 02/04/2015 and
01/28/2015.

On physical exam, no mass is palpable in the left breast.

Targeted ultrasound is performed, showing an 8 x 6 x 3 mm oval,
horizontally oriented, hypoechoic mass in the 12:30 o'clock position
of the left breast, 8 cm from the nipple. This measured 7 x 6 x 4 mm
on 02/04/2015.

There is also a 2 mm a probable oil cyst within a fat lobule with in
that portion of the breast, unchanged.

Also demonstrated is a 5 mm cyst in the 10:30 o'clock position, 5 cm
from the nipple, previous the measuring 3 mm in maximum diameter.

There is an additional 3 mm cysts in that region of the breast, more
posteriorly, not previously seen.
IMPRESSION: Left breast cysts and probable fibroadenoma, without significant
overall change.

RECOMMENDATION:
Bilateral diagnostic mammogram and left breast ultrasound in 6
months.

I have discussed the findings and recommendations with the patient.
Results were also provided in writing at the conclusion of the
visit. If applicable, a reminder letter will be sent to the patient
regarding the next appointment.

BI-RADS CATEGORY  3: Probably benign.

## 2016-04-08 MED ORDER — NAPROXEN 500 MG PO TABS: 500.0000 mg | ORAL_TABLET | Freq: Two times a day (BID) | ORAL | Status: AC

## 2016-04-08 MED ORDER — CYCLOBENZAPRINE HCL 10 MG PO TABS: 10.0000 mg | ORAL_TABLET | Freq: Two times a day (BID) | ORAL | Status: DC | PRN

## 2016-04-08 NOTE — Progress Notes (Signed)
VASCULAR LAB PRELIMINARY  PRELIMINARY  PRELIMINARY  PRELIMINARY  Right lower extremity venous duplex completed.    Preliminary report:  Right:  No evidence of DVT, superficial thrombosis, or Baker's cyst.  Debra Mccormick, RVS 04/08/2016, 8:00 PM

## 2016-04-08 NOTE — ED Notes (Signed)
Patient transported to X-ray 

## 2016-04-08 NOTE — ED Notes (Signed)
Pt c/o 1 week history of lower back pain, R leg pain. Onset after a fall last week due to a wet floor. She has tried soaking in a hot tub and ibuprofen with minimal relief. She states her R leg "feels like its giving out" since then but she has not fallen again. She denies any bowel/bladder changes. She is ambulatory, mae

## 2016-04-08 NOTE — ED Notes (Signed)
Paged Vascular Tech

## 2016-04-08 NOTE — Discharge Instructions (Signed)
Back Pain, Adult °Back pain is very common in adults. The cause of back pain is rarely dangerous and the pain often gets better over time. The cause of your back pain may not be known. Some common causes of back pain include: °· Strain of the muscles or ligaments supporting the spine. °· Wear and tear (degeneration) of the spinal disks. °· Arthritis. °· Direct injury to the back. °For many people, back pain may return. Since back pain is rarely dangerous, most people can learn to manage this condition on their own. °HOME CARE INSTRUCTIONS °Watch your back pain for any changes. The following actions may help to lessen any discomfort you are feeling: °· Remain active. It is stressful on your back to sit or stand in one place for long periods of time. Do not sit, drive, or stand in one place for more than 30 minutes at a time. Take short walks on even surfaces as soon as you are able. Try to increase the length of time you walk each day. °· Exercise regularly as directed by your health care provider. Exercise helps your back heal faster. It also helps avoid future injury by keeping your muscles strong and flexible. °· Do not stay in bed. Resting more than 1-2 days can delay your recovery. °· Pay attention to your body when you bend and lift. The most comfortable positions are those that put less stress on your recovering back. Always use proper lifting techniques, including: °· Bending your knees. °· Keeping the load close to your body. °· Avoiding twisting. °· Find a comfortable position to sleep. Use a firm mattress and lie on your side with your knees slightly bent. If you lie on your back, put a pillow under your knees. °· Avoid feeling anxious or stressed. Stress increases muscle tension and can worsen back pain. It is important to recognize when you are anxious or stressed and learn ways to manage it, such as with exercise. °· Take medicines only as directed by your health care provider. Over-the-counter  medicines to reduce pain and inflammation are often the most helpful. Your health care provider may prescribe muscle relaxant drugs. These medicines help dull your pain so you can more quickly return to your normal activities and healthy exercise. °· Apply ice to the injured area: °· Put ice in a plastic bag. °· Place a towel between your skin and the bag. °· Leave the ice on for 20 minutes, 2-3 times a day for the first 2-3 days. After that, ice and heat may be alternated to reduce pain and spasms. °· Maintain a healthy weight. Excess weight puts extra stress on your back and makes it difficult to maintain good posture. °SEEK MEDICAL CARE IF: °· You have pain that is not relieved with rest or medicine. °· You have increasing pain going down into the legs or buttocks. °· You have pain that does not improve in one week. °· You have night pain. °· You lose weight. °· You have a fever or chills. °SEEK IMMEDIATE MEDICAL CARE IF:  °· You develop new bowel or bladder control problems. °· You have unusual weakness or numbness in your arms or legs. °· You develop nausea or vomiting. °· You develop abdominal pain. °· You feel faint. °  °This information is not intended to replace advice given to you by your health care provider. Make sure you discuss any questions you have with your health care provider. °  °Document Released: 10/12/2005 Document Revised: 11/02/2014 Document Reviewed: 02/13/2014 °Elsevier Interactive Patient Education ©2016 [employer]  Cryotherapy Cryotherapy means treatment with cold. Ice or gel packs can be used to reduce both pain and swelling. Ice is the most helpful within the first 24 to 48 hours after an injury or flare-up from overusing a muscle or joint. Sprains, strains, spasms, burning pain, shooting pain, and aches can all be eased with ice. Ice can also be used when recovering from surgery. Ice is effective, has very few side effects, and is safe for most people to use. PRECAUTIONS  Ice  is not a safe treatment option for people with:  Raynaud phenomenon. This is a condition affecting small blood vessels in the extremities. Exposure to cold may cause your problems to return.  Cold hypersensitivity. There are many forms of cold hypersensitivity, including:  Cold urticaria. Red, itchy hives appear on the skin when the tissues begin to warm after being iced.  Cold erythema. This is a red, itchy rash caused by exposure to cold.  Cold hemoglobinuria. Red blood cells break down when the tissues begin to warm after being iced. The hemoglobin that carry oxygen are passed into the urine because they cannot combine with blood proteins fast enough.  Numbness or altered sensitivity in the area being iced. If you have any of the following conditions, do not use ice until you have discussed cryotherapy with your caregiver:  Heart conditions, such as arrhythmia, angina, or chronic heart disease.  High blood pressure.  Healing wounds or open skin in the area being iced.  Current infections.  Rheumatoid arthritis.  Poor circulation.  Diabetes. Ice slows the blood flow in the region it is applied. This is beneficial when trying to stop inflamed tissues from spreading irritating chemicals to surrounding tissues. However, if you expose your skin to cold temperatures for too long or without the proper protection, you can damage your skin or nerves. Watch for signs of skin damage due to cold. HOME CARE INSTRUCTIONS Follow these tips to use ice and cold packs safely.  Place a dry or damp towel between the ice and skin. A damp towel will cool the skin more quickly, so you may need to shorten the time that the ice is used.  For a more rapid response, add gentle compression to the ice.  Ice for no more than 10 to 20 minutes at a time. The bonier the area you are icing, the less time it will take to get the benefits of ice.  Check your skin after 5 minutes to make sure there are no signs  of a poor response to cold or skin damage.  Rest 20 minutes or more between uses.  Once your skin is numb, you can end your treatment. You can test numbness by very lightly touching your skin. The touch should be so light that you do not see the skin dimple from the pressure of your fingertip. When using ice, most people will feel these normal sensations in this order: cold, burning, aching, and numbness.  Do not use ice on someone who cannot communicate their responses to pain, such as small children or people with dementia. HOW TO MAKE AN ICE PACK Ice packs are the most common way to use ice therapy. Other methods include ice massage, ice baths, and cryosprays. Muscle creams that cause a cold, tingly feeling do not offer the same benefits that ice offers and should not be used as a substitute unless recommended by your caregiver. To make an ice pack, do one of the following:  Place crushed  ice or a bag of frozen vegetables in a sealable plastic bag. Squeeze out the excess air. Place this bag inside another plastic bag. Slide the bag into a pillowcase or place a damp towel between your skin and the bag.  Mix 3 parts water with 1 part rubbing alcohol. Freeze the mixture in a sealable plastic bag. When you remove the mixture from the freezer, it will be slushy. Squeeze out the excess air. Place this bag inside another plastic bag. Slide the bag into a pillowcase or place a damp towel between your skin and the bag. SEEK MEDICAL CARE IF:  You develop white spots on your skin. This may give the skin a blotchy (mottled) appearance.  Your skin turns blue or pale.  Your skin becomes waxy or hard.  Your swelling gets worse. MAKE SURE YOU:   Understand these instructions.  Will watch your condition.  Will get help right away if you are not doing well or get worse.   This information is not intended to replace advice given to you by your health care provider. Make sure you discuss any questions  you have with your health care provider.  Apply ice to affected area. Take muscle relaxers and Naprosyn as needed for pain. Follow up with your primary care doctor for symptoms do not improve. Return to the emergency department if you experience increased swelling of your calf, redness or warmth to touch, loss of control of your bowel or bladder, numbness and tingling in both lower extremities, chest pain, shortness of breath, fevers, chills.

## 2016-04-08 NOTE — ED Notes (Signed)
Vascular tech at bedside. °

## 2016-04-08 NOTE — ED Provider Notes (Signed)
CSN: [id number]     Arrival date & time 04/08/16  1524 History  By signing my name below, I, Soijett Blue, attest that this documentation has been prepared under the direction and in the presence of Donnald Garre, PA-C Electronically Signed: [place], ED Scribe. 04/08/2016. 6:13 PM.   Chief Complaint  Patient presents with  . Back Pain      The history is provided by the patient. No language interpreter was used.    HPI Comments: Debra Mccormick is a 40 y.o. female with a medical hx of HTN and DM who presents to the Emergency Department complaining of lower back pain onset 1 week. Pt states that she fell at work 8 days ago and has had back pain since. Pt reports that she slipped on a mat that was wet and fell backwards onto her right leg. She reports that the back pain does radiate to her posterior right leg and she describes the pain as aching sensation. She states that she is having associated symptoms of intermittent right leg pain. Pt reports that her right leg pain is worsened with prolonged ambulation or sitting. She states that she has tried warm soaks and ibuprofen with mild relief for her symptoms. Pt denies left leg swelling, color change, rash, wound, bowel/bladder incontinence, numbness, tingling, and any other symptoms. Denies CA or IV drug use. Pt denies recent travel, immobilization, surgery, estrogen use, birth control use, or PMHx of blood clots. Denies taking blood thinners.     Past Medical History  Diagnosis Date  . Hypertension   . Diabetes mellitus without complication [facility])    Past Surgical History  Procedure Laterality Date  . Myomectomy    . Vaginal hysterectomy     History reviewed. No pertinent family history. Social History  Substance Use Topics  . Smoking status: Former Research scientist (life sciences)  . Smokeless tobacco: None  . Alcohol Use: Yes     Comment: occassional   OB History    Gravida Para Term Preterm AB TAB SAB Ectopic Multiple Living   3 3 3       3       Review of Systems  Gastrointestinal:       No bowel incontinence  Genitourinary:       No bladder incontinence  Musculoskeletal: Positive for back pain, joint swelling and arthralgias (right leg).  Skin: Negative for color change, rash and wound.  Neurological: Negative for numbness.       No tingling  All other systems reviewed and are negative.     Allergies  Orange fruit and Tomato  Home Medications   Prior to Admission medications   Medication Sig Start Date End Date Taking? Authorizing Provider  amLODipine (NORVASC) 5 MG tablet Take 5 mg by mouth daily. 02/11/14   Historical Provider, MD  butalbital-acetaminophen-caffeine (FIORICET) 50-325-40 MG per tablet Take 1 tablet by mouth every 6 (six) hours as needed for headache. 05/23/15 05/22/16  Dorie Rank, MD  Cholecalciferol (VITAMIN D PO) Take 1 tablet by mouth daily.    Historical Provider, MD  fluconazole (DIFLUCAN) 150 MG tablet Take 1 tablet (150 mg total) by mouth daily. 05/21/15   Fransico Meadow, PA-C  hydrochlorothiazide (HYDRODIURIL) 25 MG tablet Take 1 tablet (25 mg total) by mouth daily. 11/19/14   Lavonia Drafts, MD  Linagliptin-Metformin HCl (JENTADUETO) 2.5-850 MG TABS Take 1 tablet by mouth 2 (two) times daily.    Historical Provider, MD  lisinopril (PRINIVIL,ZESTRIL) 20 MG tablet Take 20 mg by  mouth daily.    Historical Provider, MD  naproxen (NAPROSYN) 375 MG tablet Take 1 tablet (375 mg total) by mouth 2 (two) times daily as needed. 03/18/15   Robynn Pane, MD  rosuvastatin (CRESTOR) 10 MG tablet Take 10 mg by mouth daily.    Historical Provider, MD   BP 175/97 mmHg  Pulse 98  Temp(Src) 98.6 F (37 C) (Oral)  Resp 22  SpO2 99% Physical Exam  Constitutional: She is oriented to person, place, and time. She appears well-developed and well-nourished. No distress.  HENT:  Head: Normocephalic and atraumatic.  Eyes: Conjunctivae are normal. Right eye exhibits no discharge. Left eye exhibits no discharge. No  scleral icterus.  Cardiovascular: Normal rate.   Pulmonary/Chest: Effort normal.  Musculoskeletal:  Midline thoracic spinal tenderness. FROM of C, T, L spine. No step-offs or obvious bony deformities. Negative SLR. TTP of right calf. Negative homans sign. No obvious swelling or erythema.   Neurological: She is alert and oriented to person, place, and time. Coordination normal.  Skin: Skin is warm and dry. No rash noted. She is not diaphoretic. No erythema. No pallor.  Psychiatric: She has a normal mood and affect. Her behavior is normal.  Nursing note and vitals reviewed.   ED Course  Procedures (including critical care time) DIAGNOSTIC STUDIES: Oxygen Saturation is 99% on RA, nl by my interpretation.    COORDINATION OF CARE: 6:10 PM Discussed treatment plan with pt at bedside which includes t-spine xray, Vas [place] LE venous DVT and pt agreed to plan.    Labs Review Labs Reviewed - No data to display  Imaging Review Dg Thoracic Spine 2 View  04/08/2016  CLINICAL DATA:  Upper back pain after falling at work 1 week ago. EXAM: THORACIC SPINE 2 VIEWS COMPARISON:  None. FINDINGS: There are 12 rib-bearing thoracic type vertebral bodies. The alignment is normal. There is no evidence of acute fracture, paraspinal hematoma or widening of the interpedicular distance. The disc spaces are preserved. IMPRESSION: Negative thoracic spine radiographs. Electronically Signed   By: Richardean Sale M.D.   On: 04/08/2016 19:13   I have personally reviewed and evaluated these images as part of my medical decision-making.   EKG Interpretation None      MDM   Final diagnoses:  Midline thoracic back pain  Calf pain, right    Patient with back pain after mechanical fall 1 week ago.  No neurological deficits and normal neuro exam.  Patient is ambulatory.  No loss of bowel or bladder control.  No concern for cauda equina.  No fever, night sweats, weight loss, h/o cancer, IVDA, no recent procedure to back.  No urinary symptoms suggestive of UTI. Thoracic xray negative for fractures. Pt also presents with ongoing R calf pain and subjective swelling. Pt concerned for blood clot. Pt is low risk Wells criteria. Patient Vas US venous DVT negative for DVT, superficial thrombosis, or baker's cyst. Will be discharged home with flexeril and naprosyn. Supportive care and return precaution discussed. Appears safe for discharge at this time. Follow up as indicated in discharge paperwork.    I personally performed the services described in this documentation, which was scribed in my presence. The recorded information has been reviewed and is accurate.     Dondra Spry [place], PA-C 04/08/16 [place], MD 04/09/16 [phone]

## 2016-08-11 ENCOUNTER — Encounter (HOSPITAL_COMMUNITY): Payer: Self-pay

## 2016-08-11 ENCOUNTER — Emergency Department (HOSPITAL_COMMUNITY): Payer: BLUE CROSS/BLUE SHIELD

## 2016-08-11 ENCOUNTER — Emergency Department (HOSPITAL_COMMUNITY)
Admission: EM | Admit: 2016-08-11 | Discharge: 2016-08-11 | Disposition: A | Discharge status: Home / Self Care | Payer: BLUE CROSS/BLUE SHIELD | Attending: Emergency Medicine | Admitting: Emergency Medicine

## 2016-08-11 DIAGNOSIS — Z87891 Personal history of nicotine dependence: Secondary | ICD-10-CM | POA: Diagnosis not present

## 2016-08-11 DIAGNOSIS — E860 Dehydration: Secondary | ICD-10-CM | POA: Insufficient documentation

## 2016-08-11 DIAGNOSIS — E1165 Type 2 diabetes mellitus with hyperglycemia: Secondary | ICD-10-CM | POA: Diagnosis not present

## 2016-08-11 DIAGNOSIS — I10 Essential (primary) hypertension: Secondary | ICD-10-CM | POA: Insufficient documentation

## 2016-08-11 DIAGNOSIS — Z7984 Long term (current) use of oral hypoglycemic drugs: Secondary | ICD-10-CM | POA: Diagnosis not present

## 2016-08-11 DIAGNOSIS — R51 Headache: Secondary | ICD-10-CM | POA: Insufficient documentation

## 2016-08-11 DIAGNOSIS — Z79899 Other long term (current) drug therapy: Secondary | ICD-10-CM | POA: Diagnosis not present

## 2016-08-11 DIAGNOSIS — R739 Hyperglycemia, unspecified: Secondary | ICD-10-CM

## 2016-08-11 LAB — URINALYSIS, ROUTINE W REFLEX MICROSCOPIC
Bilirubin Urine: NEGATIVE
Glucose, UA: >1000 mg/dL — AB
Hgb urine dipstick: NEGATIVE
Ketones, ur: NEGATIVE mg/dL
Leukocytes, UA: NEGATIVE
NITRITE: NEGATIVE
PH: 6.5 (ref 5.0–8.0)
Protein, ur: NEGATIVE mg/dL
SPECIFIC GRAVITY, URINE: 1.031 — AB (ref 1.005–1.030)

## 2016-08-11 LAB — I-STAT CG4 LACTIC ACID, ED
LACTIC ACID, VENOUS: 3.79 mmol/L — AB (ref 0.5–1.9)
Lactic Acid, Venous: 2.08 mmol/L (ref 0.5–1.9)

## 2016-08-11 LAB — I-STAT BETA HCG BLOOD, ED (MC, WL, AP ONLY)

## 2016-08-11 LAB — BASIC METABOLIC PANEL
ANION GAP: 10 (ref 5–15)
BUN: 9 mg/dL (ref 6–20)
CALCIUM: 9.8 mg/dL (ref 8.9–10.3)
CO2: 25 mmol/L (ref 22–32)
Chloride: 102 mmol/L (ref 101–111)
Creatinine, Ser: 0.94 mg/dL (ref 0.44–1.00)
GLUCOSE: 327 mg/dL — AB (ref 65–99)
POTASSIUM: 4.2 mmol/L (ref 3.5–5.1)
Sodium: 137 mmol/L (ref 135–145)

## 2016-08-11 LAB — CBC
HEMATOCRIT: 40.8 % (ref 36.0–46.0)
HEMOGLOBIN: 13.4 g/dL (ref 12.0–15.0)
MCH: 27.8 pg (ref 26.0–34.0)
MCHC: 32.8 g/dL (ref 30.0–36.0)
MCV: 84.6 fL (ref 78.0–100.0)
Platelets: 287 10*3/uL (ref 150–400)
RBC: 4.82 MIL/uL (ref 3.87–5.11)
RDW: 15.4 % (ref 11.5–15.5)
WBC: 7 10*3/uL (ref 4.0–10.5)

## 2016-08-11 LAB — URINE MICROSCOPIC-ADD ON: BACTERIA UA: NONE SEEN

## 2016-08-11 LAB — I-STAT TROPONIN, ED: TROPONIN I, POC: 0 ng/mL (ref 0.00–0.08)

## 2016-08-11 LAB — CBG MONITORING, ED: GLUCOSE-CAPILLARY: 267 mg/dL — AB (ref 65–99)

## 2016-08-11 MED ORDER — SODIUM CHLORIDE 0.9 % IV BOLUS (SEPSIS)
1000.0000 mL | Freq: Once | INTRAVENOUS | Status: AC
  Administered 2016-08-11: 1000 mL via INTRAVENOUS

## 2016-08-11 MED ORDER — LISINOPRIL 5 MG PO TABS: 5.0000 mg | ORAL_TABLET | Freq: Every day | ORAL | 0 refills | Status: DC

## 2016-08-11 MED ORDER — LISINOPRIL 10 MG PO TABS
10.0000 mg | ORAL_TABLET | Freq: Once | ORAL | Status: AC
  Administered 2016-08-11: 10 mg via ORAL
  Filled 2016-08-11: qty 1

## 2016-08-11 NOTE — ED Triage Notes (Signed)
Patient complains of BP running high today with bilateral facial tingling since 1pm. No neuro deficits on arrival, equal grip, no facial droop, staeady gait.  Took her normal dose of norvasc this am

## 2016-08-11 NOTE — ED Provider Notes (Addendum)
[facility] DEPT Provider Note   CSN: [id number] Arrival date & time: 08/11/16  1418     History   Chief Complaint Chief Complaint  Patient presents with  . Hypertension  . bilatral facial tingling    HPI Debra Mccormick is a 40 y.o. female.  HPI 40 year old female with past medical history of poorly controlled hypertension and diabetes who presents with mild headache and bilateral facial tingling. The patient states that earlier today, she awoke and was in her usual state of health. She states that she has been under mildly increased stress recently but otherwise denies any recent medication or health changes. She went to work today and began to feel a mild, dull, aching, throbbing headache. She also noticed that her bilateral lower face felt "tingling" but denies any other numbness, weakness, or vision changes. She took her blood pressure at work and was noted to be 190/110. She subsequently was brought to the emergency department. Currently, her facial tingling has resolved but she stills has a mild headache. She also states that her blood sugars up and elevated recently. Denies any recent fevers or chills. Denies any missing of her antihypertensives. Of note, she was previously on an ACE inhibitor but was switched to a calcium channel blocker. Denies any other complaints.  Past Medical History:  Diagnosis Date  . Diabetes mellitus without complication ([place])   . Hypertension     Patient Active Problem List   Diagnosis Date Noted  . OBESITY, UNSPECIFIED 10/23/2010  . 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 HYPERTENSION 10/23/2010    Past Surgical History:  Procedure Laterality Date  . MYOMECTOMY    . VAGINAL HYSTERECTOMY      OB History    Gravida Para Term Preterm AB Living   3 3 3     3    SAB TAB Ectopic Multiple Live Births                   Home Medications    Prior to Admission medications   Medication Sig Start Date End Date  Taking? Authorizing Provider  amLODipine (NORVASC) 5 MG tablet Take 5 mg by mouth daily. 02/11/14  Yes Historical Provider, MD  canagliflozin (INVOKANA) 300 MG TABS tablet Take 300 mg by mouth daily before breakfast.   Yes Historical Provider, MD  Cholecalciferol (VITAMIN D PO) Take 1 tablet by mouth daily as needed. Patient states she only takes when she thinks about it   Yes Historical Provider, MD  naproxen (NAPROSYN) 500 MG tablet Take 500 mg by mouth 2 (two) times daily as needed for mild pain.   Yes Historical Provider, MD  rosuvastatin (CRESTOR) 10 MG tablet Take 10 mg by mouth daily.   Yes Historical Provider, MD  cyclobenzaprine (FLEXERIL) 10 MG tablet Take 1 tablet (10 mg total) by mouth 2 (two) times daily as needed for muscle spasms. Patient not taking: Reported on 08/11/2016 04/08/16   Samantha Tripp [place], PA-C  fluconazole (DIFLUCAN) 150 MG tablet Take 1 tablet (150 mg total) by mouth daily. Patient not taking: Reported on 08/11/2016 05/21/15   Fransico Meadow, PA-C  hydrochlorothiazide (HYDRODIURIL) 25 MG tablet Take 1 tablet (25 mg total) by mouth daily. Patient not taking: Reported on 08/11/2016 11/19/14   Lavonia Drafts, MD  lisinopril (PRINIVIL,ZESTRIL) 5 MG tablet Take 1 tablet (5 mg total) by mouth daily. 08/11/16   Duffy Bruce, MD  naproxen (NAPROSYN) 375 MG tablet Take 1 tablet (375 mg total) by  mouth 2 (two) times daily as needed. Patient not taking: Reported on 08/11/2016 03/18/15   Robynn Pane, MD  naproxen (NAPROSYN) 500 MG tablet Take 1 tablet (500 mg total) by mouth 2 (two) times daily. Patient not taking: Reported on 08/11/2016 04/08/16   Carlos Levering, PA-C    Family History No family history on file.  Social History Social History  Substance Use Topics  . Smoking status: Former Research scientist (life sciences)  . Smokeless tobacco: Never Used  . Alcohol use Yes     Comment: occassional     Allergies   Orange fruit [citrus] and Tomato   Review of  Systems Review of Systems  Constitutional: Negative for chills and fever.  HENT: Negative for congestion, rhinorrhea and sore throat.   Eyes: Negative for visual disturbance.  Respiratory: Negative for cough, shortness of breath and wheezing.   Cardiovascular: Negative for chest pain and leg swelling.  Gastrointestinal: Negative for abdominal pain, diarrhea, nausea and vomiting.  Genitourinary: Negative for dysuria, flank pain, vaginal bleeding and vaginal discharge.  Musculoskeletal: Negative for neck pain.  Skin: Negative for rash.  Allergic/Immunologic: Negative for immunocompromised state.  Neurological: Positive for numbness and headaches. Negative for syncope, speech difficulty and light-headedness.  Hematological: Does not bruise/bleed easily.  All other systems reviewed and are negative.    Physical Exam Updated Vital Signs BP 126/84   Pulse 79   Temp 98.4 F (36.9 C) (Oral)   Resp 18   SpO2 95%   Physical Exam  Constitutional: She is oriented to person, place, and time. She appears well-developed and well-nourished. No distress.  HENT:  Head: Normocephalic and atraumatic.  Eyes: Conjunctivae are normal.  Neck: Neck supple.  Cardiovascular: Normal rate, regular rhythm and normal heart sounds.  Exam reveals no friction rub.   No murmur heard. Pulmonary/Chest: Effort normal and breath sounds normal. No respiratory distress. She has no wheezes. She has no rales.  Abdominal: She exhibits no distension.  Musculoskeletal: She exhibits no edema.  Neurological: She is alert and oriented to person, place, and time. She exhibits normal muscle tone.  Skin: Skin is warm. Capillary refill takes less than 2 seconds.  Psychiatric: She has a normal mood and affect.  Nursing note and vitals reviewed.  Neurological Exam:  Mental Status: Alert and oriented to person, place, and time. Attention and concentration normal. Speech clear. Recent memory is intact. Cranial Nerves: Visual  fields intact to confrontation in all quadrants bilaterally. EOMI and PERRLA. No nystagmus noted. Facial sensation intact at forehead, maxillary cheek, and chin/mandible bilaterally. No weakness of masticatory muscles. No facial asymmetry or weakness. Hearing grossly normal to finer rub. Uvula is midline, and palate elevates symmetrically. Normal SCM and trapezius strength. Tongue midline without fasciculations Motor: Muscle strength 5/5 in proximal and distal UE and LE bilaterally. No pronator drift. Muscle tone normal. Reflexes: 2+ and symmetrical in all four extremities.  Sensation: Intact to light touch in upper and lower extremities distally bilaterally.  Gait: Normal without ataxia. Coordination: Normal FTN bilaterally.    ED Treatments / Results  Labs (all labs ordered are listed, but only abnormal results are displayed) Labs Reviewed  BASIC METABOLIC PANEL - Abnormal; Notable for the following:       Result Value   Glucose, Bld 327 (*)    All other components within normal limits  URINALYSIS, ROUTINE W REFLEX MICROSCOPIC (NOT AT [facility]) - Abnormal; Notable for the following:    Specific Gravity, Urine 1.031 (*)    Glucose, UA >  1000 (*)    All other components within normal limits  URINE MICROSCOPIC-ADD ON - Abnormal; Notable for the following:    Squamous Epithelial / LPF 0-5 (*)    All other components within normal limits  CBG MONITORING, ED - Abnormal; Notable for the following:    Glucose-Capillary 267 (*)    All other components within normal limits  I-STAT CG4 LACTIC ACID, ED - Abnormal; Notable for the following:    Lactic Acid, Venous 3.79 (*)    All other components within normal limits  I-STAT CG4 LACTIC ACID, ED - Abnormal; Notable for the following:    Lactic Acid, Venous 2.08 (*)    All other components within normal limits  CBC  I-STAT BETA HCG BLOOD, ED ([facility], [facility], [facility] ONLY)  I-STAT TROPOININ, ED    EKG  EKG Interpretation  Date/Time:  Tuesday August 11 2016  14:25:33 EDT Ventricular Rate:  103 PR Interval:  140 QRS Duration: 88 QT Interval:  346 QTC Calculation: 453 R Axis:   67 Text Interpretation:  Sinus tachycardia Nonspecific T wave abnormality Abnormal ECG Since last tracing, non-specific ST changes No acute ST elevations or depressions Confirmed by Ellender Hose MD, Lysbeth Galas [phone]) on 08/11/2016 4:02:51 PM       Radiology Ct Head Wo Contrast  Result Date: 08/11/2016 CLINICAL DATA:  Left-sided headache and facial tingling today. EXAM: CT HEAD WITHOUT CONTRAST TECHNIQUE: Contiguous axial images were obtained from the base of the skull through the vertex without intravenous contrast. COMPARISON:  04/02/2014 FINDINGS: Brain: There is no evidence of acute cortical infarct, intracranial hemorrhage, mass effect, or extra-axial fluid collection. The ventricles and sulci are normal. 7 mm focus of dense calcification or ossification along the greater wing of the sphenoid in the left middle cranial fossa is unchanged and may represent an exostosis or incidental meningioma. Vascular: No hyperdense vessel or unexpected calcification. Skull: No fracture or suspicious osseous lesion. Sinuses/Orbits: No acute finding. Other: None. IMPRESSION: No evidence of acute intracranial abnormality. Electronically Signed   By: Logan Bores M.D.   On: 08/11/2016 15:21    Procedures Procedures (including critical care time)  Medications Ordered in ED Medications  sodium chloride 0.9 % bolus 1,000 mL (0 mLs Intravenous Stopped 08/11/16 1945)  lisinopril (PRINIVIL,ZESTRIL) tablet 10 mg (10 mg Oral Given 08/11/16 1624)     Initial Impression / Assessment and Plan / ED Course  I have reviewed the triage vital signs and the nursing notes.  Pertinent labs & imaging results that were available during my care of the patient were reviewed by me and considered in my medical decision making (see chart for details).  Clinical Course    40 year old female with past medical  history of orally controlled hypertension and diabetes who presents with hypertension and transient, mild headache and facial tingling. No other neurological deficits. On arrival here, patient markedly hypertensive but otherwise well-appearing. Labwork and CT head obtained in triage reviewed. CT head shows no acute antibody. Labwork shows hyperglycemia as well as mild lactic acidosis, which I suspect is secondary to dehydration. She does admit to poor by mouth intake. Otherwise, EKG is nonischemic. She had no associated chest pain shortness of breath or other signs of hypertensive emergency. Will check troponin to evaluate for cardiac injury and give by mouth ACE inhibitor. Will also give fluids for lactic acidosis and plan for repeat.  Repeat blood sugar as well as lactic acid are improved. Urinalysis shows mild dehydration but is otherwise unremarkable. Blood pressure has  markedly improved with lisinopril. Renal function is normal, troponin is negative, and she shows no evidence of significant end organ dysfunction. She is now asymptomatic. Suspect mild symptomatically hypertension. Will place back on lisinopril 5 mg and advised outpatient follow-up. Side effects and adverse effects of lisinopril were discussed in detail patient was given strict return precautions.  Final Clinical Impressions(s) / ED Diagnoses   Final diagnoses:  Hypertension, unspecified type  Dehydration  Hyperglycemia    New Prescriptions Discharge Medication List as of 08/11/2016  6:35 PM    START taking these medications   Details  lisinopril (PRINIVIL,ZESTRIL) 5 MG tablet Take 1 tablet (5 mg total) by mouth daily., Starting Tue 08/11/2016, Print         Duffy Bruce, MD 08/12/16 [id number]    Duffy Bruce, MD 08/12/16 [phone]

## 2016-08-11 NOTE — ED Notes (Signed)
CBG 267 

## 2016-09-24 DIAGNOSIS — Z135 Encounter for screening for eye and ear disorders: Secondary | ICD-10-CM | POA: Diagnosis not present

## 2016-09-24 DIAGNOSIS — E1165 Type 2 diabetes mellitus with hyperglycemia: Secondary | ICD-10-CM | POA: Diagnosis not present

## 2016-09-24 DIAGNOSIS — I10 Essential (primary) hypertension: Secondary | ICD-10-CM | POA: Diagnosis not present

## 2016-12-21 DIAGNOSIS — E782 Mixed hyperlipidemia: Secondary | ICD-10-CM | POA: Diagnosis not present

## 2016-12-21 DIAGNOSIS — E1165 Type 2 diabetes mellitus with hyperglycemia: Secondary | ICD-10-CM | POA: Diagnosis not present

## 2016-12-21 DIAGNOSIS — E785 Hyperlipidemia, unspecified: Secondary | ICD-10-CM | POA: Diagnosis not present

## 2016-12-21 DIAGNOSIS — I10 Essential (primary) hypertension: Secondary | ICD-10-CM | POA: Diagnosis not present

## 2017-01-06 DIAGNOSIS — I10 Essential (primary) hypertension: Secondary | ICD-10-CM | POA: Diagnosis not present

## 2017-01-06 DIAGNOSIS — E782 Mixed hyperlipidemia: Secondary | ICD-10-CM | POA: Diagnosis not present

## 2017-01-06 DIAGNOSIS — E1165 Type 2 diabetes mellitus with hyperglycemia: Secondary | ICD-10-CM | POA: Diagnosis not present

## 2017-02-08 DIAGNOSIS — Z6836 Body mass index (BMI) 36.0-36.9, adult: Secondary | ICD-10-CM | POA: Diagnosis not present

## 2017-02-08 DIAGNOSIS — I10 Essential (primary) hypertension: Secondary | ICD-10-CM | POA: Diagnosis not present

## 2017-02-08 DIAGNOSIS — E1165 Type 2 diabetes mellitus with hyperglycemia: Secondary | ICD-10-CM | POA: Diagnosis not present

## 2017-04-29 DIAGNOSIS — E785 Hyperlipidemia, unspecified: Secondary | ICD-10-CM | POA: Diagnosis not present

## 2017-04-29 DIAGNOSIS — E1165 Type 2 diabetes mellitus with hyperglycemia: Secondary | ICD-10-CM | POA: Diagnosis not present

## 2017-04-29 DIAGNOSIS — I10 Essential (primary) hypertension: Secondary | ICD-10-CM | POA: Diagnosis not present

## 2017-04-29 DIAGNOSIS — E782 Mixed hyperlipidemia: Secondary | ICD-10-CM | POA: Diagnosis not present

## 2017-07-28 DIAGNOSIS — H5213 Myopia, bilateral: Secondary | ICD-10-CM | POA: Diagnosis not present

## 2017-07-29 DIAGNOSIS — E782 Mixed hyperlipidemia: Secondary | ICD-10-CM | POA: Diagnosis not present

## 2017-07-29 DIAGNOSIS — E1165 Type 2 diabetes mellitus with hyperglycemia: Secondary | ICD-10-CM | POA: Diagnosis not present

## 2017-07-29 DIAGNOSIS — I10 Essential (primary) hypertension: Secondary | ICD-10-CM | POA: Diagnosis not present

## 2017-08-16 ENCOUNTER — Other Ambulatory Visit: Payer: Self-pay | Admitting: Family Medicine

## 2017-08-16 DIAGNOSIS — N63 Unspecified lump in unspecified breast: Secondary | ICD-10-CM

## 2017-08-24 ENCOUNTER — Other Ambulatory Visit: Payer: BLUE CROSS/BLUE SHIELD

## 2017-08-25 ENCOUNTER — Ambulatory Visit
Admission: RE | Admit: 2017-08-25 | Discharge: 2017-08-25 | Disposition: A | Discharge status: Home / Self Care | Payer: BLUE CROSS/BLUE SHIELD | Source: Ambulatory Visit | Attending: Family Medicine | Admitting: Family Medicine

## 2017-08-25 DIAGNOSIS — N63 Unspecified lump in unspecified breast: Secondary | ICD-10-CM

## 2017-08-25 DIAGNOSIS — R928 Other abnormal and inconclusive findings on diagnostic imaging of breast: Secondary | ICD-10-CM | POA: Diagnosis not present

## 2017-08-25 DIAGNOSIS — N6321 Unspecified lump in the left breast, upper outer quadrant: Secondary | ICD-10-CM | POA: Diagnosis not present

## 2017-09-13 DIAGNOSIS — E1165 Type 2 diabetes mellitus with hyperglycemia: Secondary | ICD-10-CM | POA: Diagnosis not present

## 2017-09-13 DIAGNOSIS — I10 Essential (primary) hypertension: Secondary | ICD-10-CM | POA: Diagnosis not present

## 2017-09-13 DIAGNOSIS — E782 Mixed hyperlipidemia: Secondary | ICD-10-CM | POA: Diagnosis not present

## 2017-12-15 ENCOUNTER — Encounter (HOSPITAL_COMMUNITY): Payer: Self-pay | Admitting: *Deleted

## 2017-12-15 ENCOUNTER — Other Ambulatory Visit: Payer: Self-pay

## 2017-12-15 ENCOUNTER — Emergency Department (HOSPITAL_COMMUNITY)
Admission: EM | Admit: 2017-12-15 | Discharge: 2017-12-15 | Disposition: A | Discharge status: Home / Self Care | Payer: BLUE CROSS/BLUE SHIELD | Attending: Emergency Medicine | Admitting: Emergency Medicine

## 2017-12-15 ENCOUNTER — Emergency Department (HOSPITAL_COMMUNITY): Payer: BLUE CROSS/BLUE SHIELD

## 2017-12-15 DIAGNOSIS — B9789 Other viral agents as the cause of diseases classified elsewhere: Secondary | ICD-10-CM

## 2017-12-15 DIAGNOSIS — R51 Headache: Secondary | ICD-10-CM | POA: Insufficient documentation

## 2017-12-15 DIAGNOSIS — E119 Type 2 diabetes mellitus without complications: Secondary | ICD-10-CM | POA: Insufficient documentation

## 2017-12-15 DIAGNOSIS — Z79899 Other long term (current) drug therapy: Secondary | ICD-10-CM | POA: Insufficient documentation

## 2017-12-15 DIAGNOSIS — R0602 Shortness of breath: Secondary | ICD-10-CM | POA: Diagnosis not present

## 2017-12-15 DIAGNOSIS — R0981 Nasal congestion: Secondary | ICD-10-CM | POA: Diagnosis not present

## 2017-12-15 DIAGNOSIS — I10 Essential (primary) hypertension: Secondary | ICD-10-CM | POA: Insufficient documentation

## 2017-12-15 DIAGNOSIS — R05 Cough: Secondary | ICD-10-CM | POA: Diagnosis not present

## 2017-12-15 DIAGNOSIS — Z87891 Personal history of nicotine dependence: Secondary | ICD-10-CM | POA: Insufficient documentation

## 2017-12-15 DIAGNOSIS — J069 Acute upper respiratory infection, unspecified: Secondary | ICD-10-CM | POA: Diagnosis not present

## 2017-12-15 DIAGNOSIS — R079 Chest pain, unspecified: Secondary | ICD-10-CM | POA: Diagnosis not present

## 2017-12-15 DIAGNOSIS — R509 Fever, unspecified: Secondary | ICD-10-CM | POA: Diagnosis not present

## 2017-12-15 LAB — CBC WITH DIFFERENTIAL/PLATELET
Basophils Absolute: 0 10*3/uL (ref 0.0–0.1)
Basophils Relative: 0 %
Eosinophils Absolute: 0.1 10*3/uL (ref 0.0–0.7)
Eosinophils Relative: 2 %
HCT: 38 % (ref 36.0–46.0)
Hemoglobin: 12.2 g/dL (ref 12.0–15.0)
Lymphocytes Relative: 21 %
Lymphs Abs: 1.2 10*3/uL (ref 0.7–4.0)
MCH: 27.9 pg (ref 26.0–34.0)
MCHC: 32.1 g/dL (ref 30.0–36.0)
MCV: 87 fL (ref 78.0–100.0)
Monocytes Absolute: 0.3 10*3/uL (ref 0.1–1.0)
Monocytes Relative: 6 %
Neutro Abs: 4.1 10*3/uL (ref 1.7–7.7)
Neutrophils Relative %: 71 %
Platelets: 254 10*3/uL (ref 150–400)
RBC: 4.37 MIL/uL (ref 3.87–5.11)
RDW: 15.5 % (ref 11.5–15.5)
WBC: 5.7 10*3/uL (ref 4.0–10.5)

## 2017-12-15 LAB — BASIC METABOLIC PANEL
Anion gap: 12 (ref 5–15)
BUN: 17 mg/dL (ref 6–20)
CO2: 24 mmol/L (ref 22–32)
Calcium: 10.1 mg/dL (ref 8.9–10.3)
Chloride: 101 mmol/L (ref 101–111)
Creatinine, Ser: 0.84 mg/dL (ref 0.44–1.00)
GFR calc Af Amer: >60 mL/min (ref >60)
GFR calc non Af Amer: >60 mL/min (ref >60)
Glucose, Bld: 186 mg/dL — ABNORMAL HIGH (ref 65–99)
Potassium: 3.6 mmol/L (ref 3.5–5.1)
Sodium: 137 mmol/L (ref 135–145)

## 2017-12-15 LAB — INFLUENZA PANEL BY PCR (TYPE A & B)
Influenza A By PCR: NEGATIVE
Influenza B By PCR: NEGATIVE

## 2017-12-15 LAB — RAPID STREP SCREEN (MED CTR MEBANE ONLY): Streptococcus, Group A Screen (Direct): NEGATIVE

## 2017-12-15 MED ORDER — SODIUM CHLORIDE 0.9 % IV BOLUS (SEPSIS)
1000.0000 mL | Freq: Once | INTRAVENOUS | Status: AC
  Administered 2017-12-15: 1000 mL via INTRAVENOUS

## 2017-12-15 MED ORDER — FLUTICASONE PROPIONATE 50 MCG/ACT NA SUSP: 2.0000 | Freq: Every day | NASAL | 0 refills | Status: DC

## 2017-12-15 MED ORDER — ACETAMINOPHEN 325 MG PO TABS: 650.0000 mg | ORAL_TABLET | Freq: Once | ORAL | Status: DC

## 2017-12-15 MED ORDER — IBUPROFEN 400 MG PO TABS
600.0000 mg | ORAL_TABLET | Freq: Once | ORAL | Status: AC
  Administered 2017-12-15: 09:00:00 600 mg via ORAL
  Filled 2017-12-15: qty 1

## 2017-12-15 NOTE — Discharge Instructions (Signed)
Continue to stay well-hydrated. Gargle warm salt water and spit it out for sore throat. May also use cough drops, warm teas, etc. Take flonase to decrease nasal congestion.  Over-the-counter allergy medication or cold medicines for nasal congestion and scratchy throat. Alternate 600 mg of ibuprofen and [phone] mg of Tylenol every 3 hours as needed for pain/fever. Do not exceed 4000 mg of Tylenol daily.  Take ibuprofen with food to avoid upset stomach issues.   Followup with your primary care doctor in 5-7 days for recheck of ongoing symptoms. Return to emergency department for emergent changing or worsening of symptoms such as throat tightness, facial swelling, fever not controlled by ibuprofen or Tylenol,difficulty breathing, or chest pain.

## 2017-12-15 NOTE — ED Provider Notes (Signed)
[facility] Provider Note   CSN: [id number] Arrival date & time: 12/15/17  0708     History   Chief Complaint Chief Complaint  Patient presents with  . Influenza    HPI Debra Mccormick is a 42 y.o. female with history of DM, hypertension, obesity, and iron deficiency anemia presents for evaluation of flulike symptoms since last night.  She states she developed generalized myalgias at around 8 PM as well as subjective fevers and chills.  T-max 100.7 F.  This morning she  took 2 tablets of Tylenol with mild improvement.  She notes associated nonproductive cough, mild sore throat and mild nasal congestion.  She also notes frontal throbbing headache which does not radiate and is not associated with nausea, vomiting, or vision changes.  She states this headache is similar to headaches she has had in the past.  Of note, her 2 grandchildren recently tested positive for flu.  Denies chest pain, abdominal pain.  No recent travel.  The history is provided by the patient.    Past Medical History:  Diagnosis Date  . Diabetes mellitus without complication ([place])   . Hypertension     Patient Active Problem List   Diagnosis Date Noted  . OBESITY, UNSPECIFIED 10/23/2010  . 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 HYPERTENSION 10/23/2010    Past Surgical History:  Procedure Laterality Date  . MYOMECTOMY    . VAGINAL HYSTERECTOMY      OB History    Gravida Para Term Preterm AB Living   3 3 3     3    SAB TAB Ectopic Multiple Live Births                   Home Medications    Prior to Admission medications   Medication Sig Start Date End Date Taking? Authorizing Provider  amLODipine (NORVASC) 5 MG tablet Take 5 mg by mouth daily. 02/11/14   [provider]  canagliflozin (INVOKANA) 300 MG TABS tablet Take 300 mg by mouth daily before breakfast.    [provider]  Cholecalciferol (VITAMIN D PO) Take 1  tablet by mouth daily as needed. Patient states she only takes when she thinks about it    [provider]  cyclobenzaprine (FLEXERIL) 10 MG tablet Take 1 tablet (10 mg total) by mouth 2 (two) times daily as needed for muscle spasms. Patient not taking: Reported on 08/11/2016 04/08/16   Dowless, Aldona Bar Tripp, PA-C  fluconazole (DIFLUCAN) 150 MG tablet Take 1 tablet (150 mg total) by mouth daily. Patient not taking: Reported on 08/11/2016 05/21/15   Fransico Meadow, PA-C  fluticasone [facility]) 50 MCG/ACT nasal spray Place 2 sprays into both nostrils daily. 12/15/17   Nils Flack, Kiyanna Biegler A, PA-C  hydrochlorothiazide (HYDRODIURIL) 25 MG tablet Take 1 tablet (25 mg total) by mouth daily. Patient not taking: Reported on 08/11/2016 11/19/14   Lavonia Drafts, MD  lisinopril (PRINIVIL,ZESTRIL) 5 MG tablet Take 1 tablet (5 mg total) by mouth daily. 08/11/16   Duffy Bruce, MD  naproxen (NAPROSYN) 375 MG tablet Take 1 tablet (375 mg total) by mouth 2 (two) times daily as needed. Patient not taking: Reported on 08/11/2016 03/18/15   Robynn Pane, MD  naproxen (NAPROSYN) 500 MG tablet Take 1 tablet (500 mg total) by mouth 2 (two) times daily. Patient not taking: Reported on 08/11/2016 04/08/16   Dowless, Aldona Bar Tripp, PA-C  naproxen (NAPROSYN) 500 MG tablet Take 500 mg by mouth  2 (two) times daily as needed for mild pain.    [provider]  rosuvastatin (CRESTOR) 10 MG tablet Take 10 mg by mouth daily.    [provider]    Family History History reviewed. No pertinent family history.  Social History Social History   Tobacco Use  . Smoking status: Former Research scientist (life sciences)  . Smokeless tobacco: Never Used  Substance Use Topics  . Alcohol use: Yes    Comment: occassional  . Drug use: No     Allergies   Orange fruit [citrus] and Tomato   Review of Systems Review of Systems  Constitutional: Positive for chills and fever.  HENT: Positive for congestion and sore throat.     Eyes: Negative for visual disturbance.  Respiratory: Positive for cough. Negative for shortness of breath.   Cardiovascular: Negative for chest pain.  Gastrointestinal: Negative for abdominal pain, nausea and vomiting.  Musculoskeletal: Negative for neck pain and neck stiffness.  Neurological: Positive for headaches. Negative for syncope.  All other systems reviewed and are negative.    Physical Exam Updated Vital Signs BP 95/75   Pulse 60   Temp 100 F (37.8 C) (Oral)   Resp 20   SpO2 99%   Physical Exam  Constitutional: She appears well-developed and well-nourished. No distress.  HENT:  Head: Normocephalic and atraumatic.  Right Ear: External ear normal.  Left Ear: External ear normal.  Mouth/Throat: Oropharynx is clear and moist.  No frontal or maxillary sinus tenderness.  Nasal septum midline with mild mucosal edema, worse on the right.  Posterior oropharynx without erythema, tonsillar hypertrophy, exudates, or uvular deviation.  No trismus or sublingual abnormalities.  TMs without erythema or bulging bilaterally peer  Eyes: Conjunctivae are normal. Right eye exhibits no discharge. Left eye exhibits no discharge.  Neck: Normal range of motion and full passive range of motion without pain. Neck supple. No JVD present. No tracheal deviation present.  Cardiovascular: Regular rhythm and normal heart sounds.  Mildly tachycardic, 2+ radial and DP/PT pulses bl, negative Homan's bl   Pulmonary/Chest: Effort normal and breath sounds normal. No stridor. No respiratory distress. She has no wheezes. She has no rales. She exhibits no tenderness.  Equal rise and fall of chest, no increased work of breathing  Abdominal: Soft. Bowel sounds are normal. She exhibits no distension. There is no tenderness.  Musculoskeletal: Normal range of motion. She exhibits no edema or tenderness.  Lymphadenopathy:    She has no cervical adenopathy.  Neurological: She is alert.  Skin: Skin is warm and  dry. No erythema.  Psychiatric: She has a normal mood and affect. Her behavior is normal.  Nursing note and vitals reviewed.    ED Treatments / Results  Labs (all labs ordered are listed, but only abnormal results are displayed) Labs Reviewed  BASIC METABOLIC PANEL - Abnormal; Notable for the following components:      Result Value   Glucose, Bld 186 (*)    All other components within normal limits  RAPID STREP SCREEN (NOT AT [facility])  CULTURE, GROUP A STREP [facility])  INFLUENZA PANEL BY PCR (TYPE A & B)  CBC WITH DIFFERENTIAL/PLATELET    EKG  EKG Interpretation None       Radiology Dg Chest 2 View  Result Date: 12/15/2017 CLINICAL DATA:  Shortness of breath and chest pain EXAM: CHEST  2 VIEW COMPARISON:  04/08/2016 FINDINGS: The heart size and mediastinal contours are within normal limits. Both lungs are clear. The visualized skeletal structures are  unremarkable. IMPRESSION: No active cardiopulmonary disease. Electronically Signed   By: Inez Catalina M.D.   On: 12/15/2017 08:42    Procedures Procedures (including critical care time)  Medications Ordered in ED Medications  ibuprofen (ADVIL,MOTRIN) tablet 600 mg (600 mg Oral Given 12/15/17 0852)  sodium chloride 0.9 % bolus 1,000 mL (1,000 mLs Intravenous New Bag/Given 12/15/17 3491)     Initial Impression / Assessment and Plan / ED Course  I have reviewed the triage vital signs and the nursing notes.  Pertinent labs & imaging results that were available during my care of the patient were reviewed by me and considered in my medical decision making (see chart for details).     Patient presents with flulike symptoms which began last night at around 8 PM.  Initially tachycardic with resolution while in the ED after administration of fluids.  She is nontoxic in appearance.  Chest x-ray shows no acute cardiopulmonary abnormalities such as pneumonia or pleural effusion.  No leukocytosis, no significant electrolyte abnormalities.  No  fever or meningeal signs to suggest meningitis.  Rapid strep negative and presentation is not concerning for PTA.  Although she has had sick contacts with influenza, influenza test was negative.  On reevaluation, patient is resting comfortably in no apparent distress.  Suspect viral URI with cough.  Discussed symptomatic treatment.  Recommend follow-up with PCP if symptoms persist.  Discussed indications for return to the ED. Pt verbalized understanding of and agreement with plan and is safe for discharge home at this time. No complaints prior to discharge.  Final Clinical Impressions(s) / ED Diagnoses   Final diagnoses:  Viral URI with cough    ED Discharge Orders        Ordered    fluticasone (FLONASE) 50 MCG/ACT nasal spray  Daily     12/15/17 [address] PA-C 12/15/17 1013  Gareth Morgan, MD 12/15/17 2151

## 2017-12-15 NOTE — ED Triage Notes (Signed)
Pt reports headache, fever, sore throat, cough, body aches since last night. Denies n/v/d. Mask on pt at triage.

## 2017-12-15 NOTE — ED Notes (Signed)
Patient transported to X-ray 

## 2017-12-17 LAB — CULTURE, GROUP A STREP (THRC)

## 2018-02-02 DIAGNOSIS — I10 Essential (primary) hypertension: Secondary | ICD-10-CM | POA: Diagnosis not present

## 2018-02-02 DIAGNOSIS — E1165 Type 2 diabetes mellitus with hyperglycemia: Secondary | ICD-10-CM | POA: Diagnosis not present

## 2018-02-02 DIAGNOSIS — E0800 Diabetes mellitus due to underlying condition with hyperosmolarity without nonketotic hyperglycemic-hyperosmolar coma (NKHHC): Secondary | ICD-10-CM | POA: Diagnosis not present

## 2018-02-02 DIAGNOSIS — E785 Hyperlipidemia, unspecified: Secondary | ICD-10-CM | POA: Diagnosis not present

## 2018-02-02 DIAGNOSIS — E782 Mixed hyperlipidemia: Secondary | ICD-10-CM | POA: Diagnosis not present

## 2018-04-25 IMAGING — MG 2D DIGITAL DIAGNOSTIC BILATERAL MAMMOGRAM WITH CAD AND ADJUNCT T
8 of 12 series · 8 of 28 positions shown · non-contrast
Comparison: Previous exam(s).

CLINICAL DATA: Followup probable fibroadenoma in the 12:30 o'clock
position of the left breast.

EXAM:
2D DIGITAL DIAGNOSTIC BILATERAL MAMMOGRAM WITH CAD AND ADJUNCT TOMO
ULTRASOUND LEFT BREAST

[L CC synth-2D]
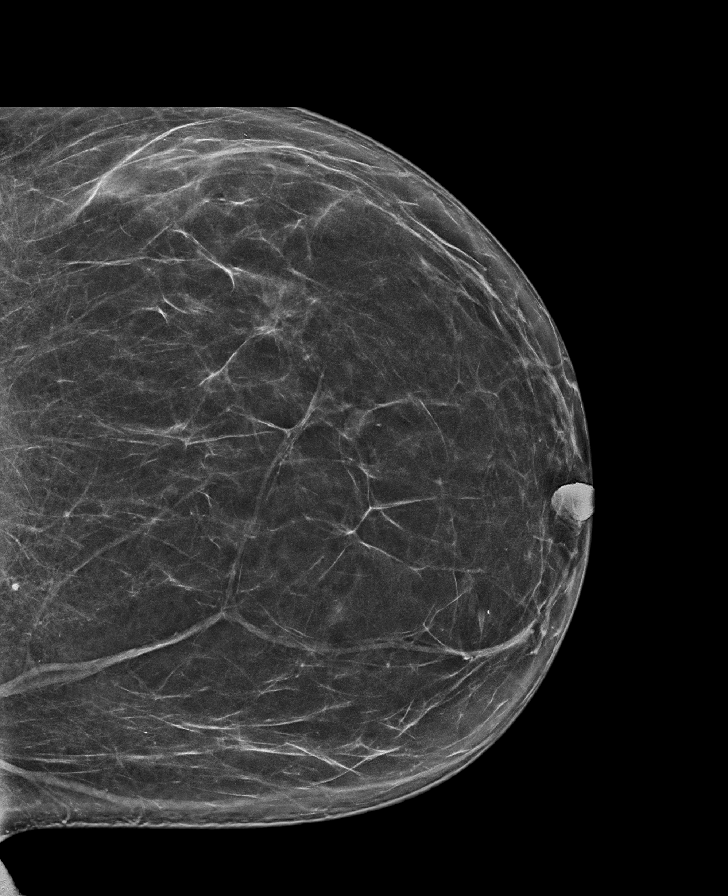

[L CC]
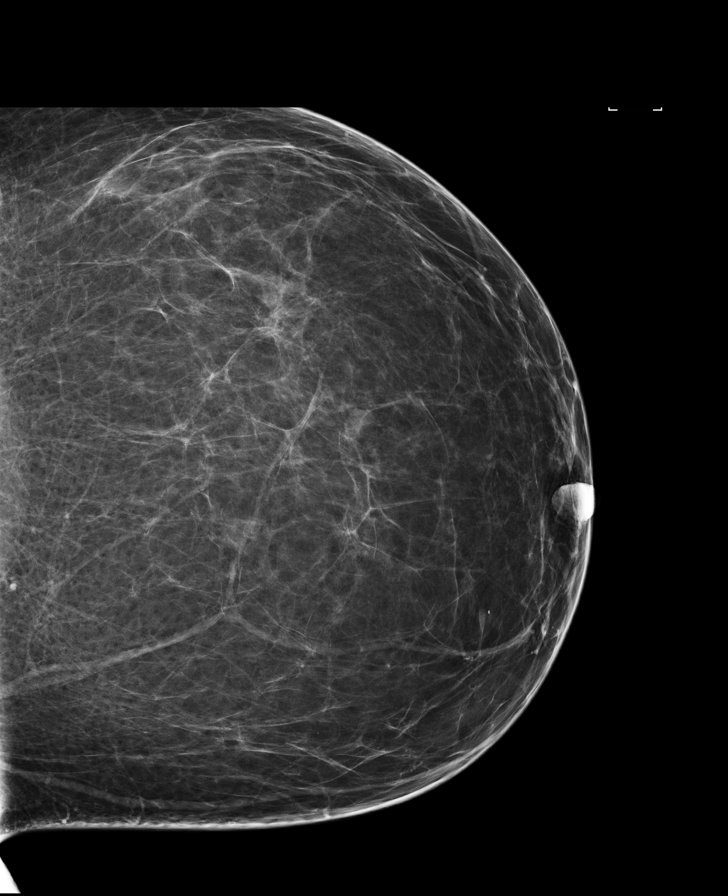

[R MLO synth-2D]
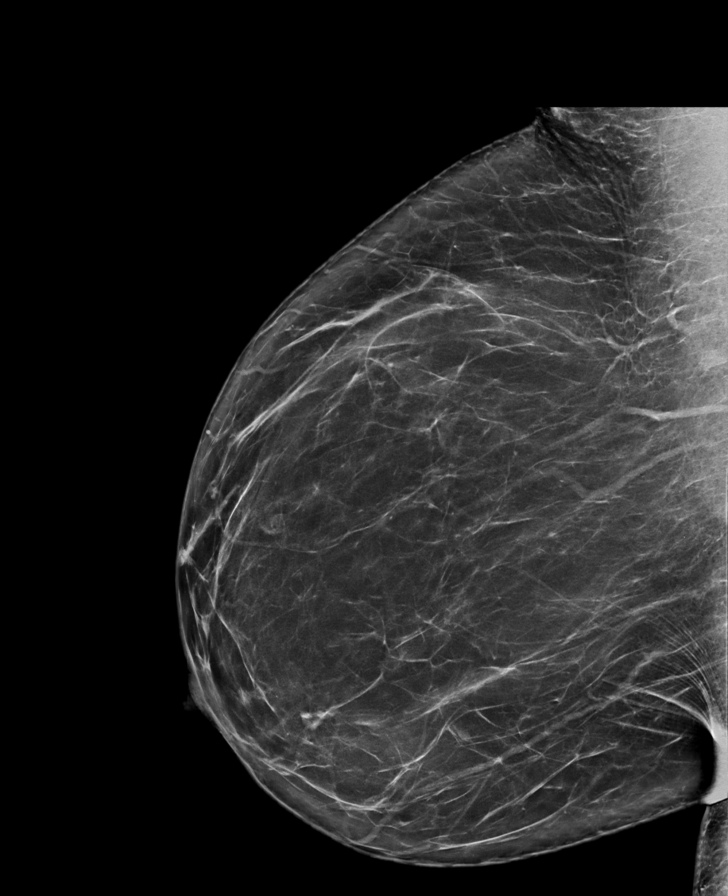

[R MLO]
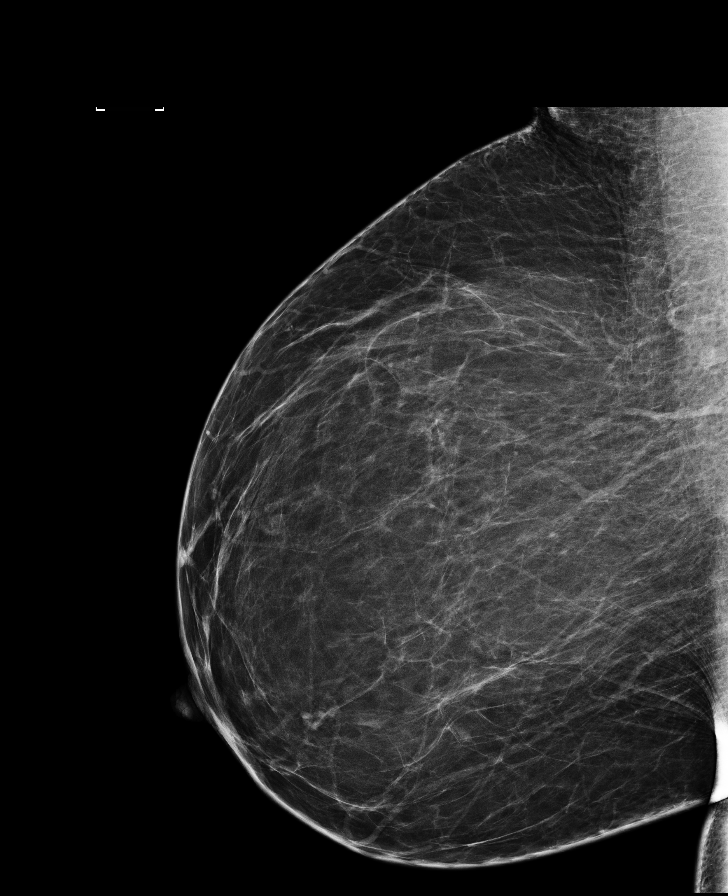

[L MLO synth-2D]
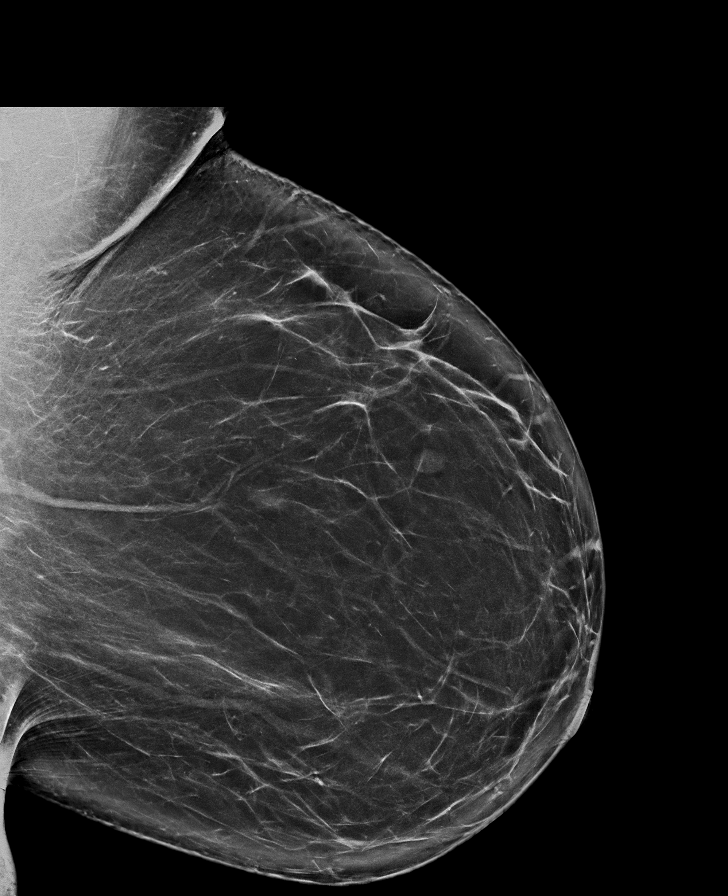

[R CC]
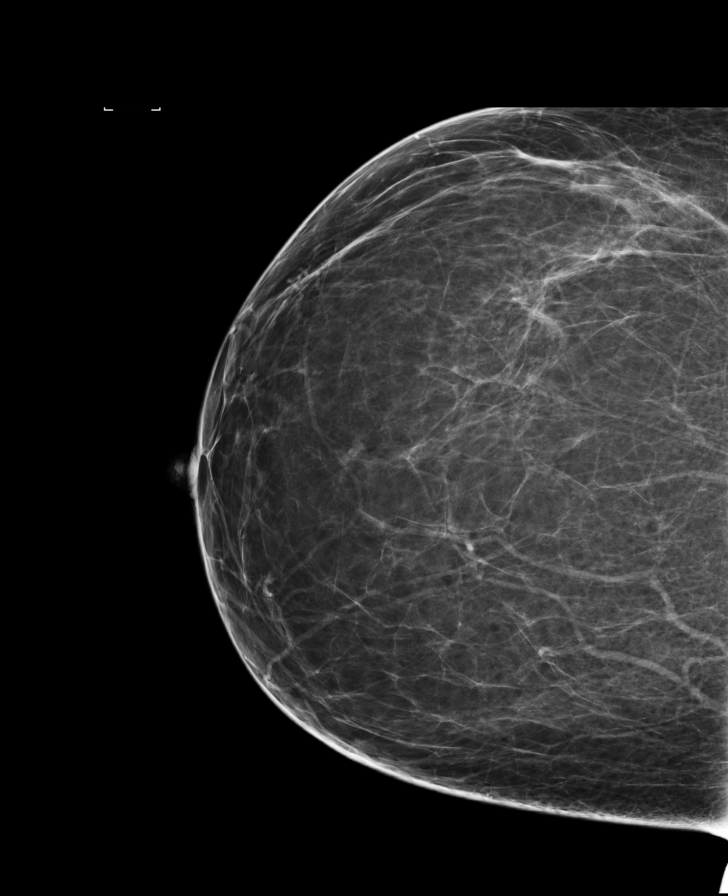

[L MLO]
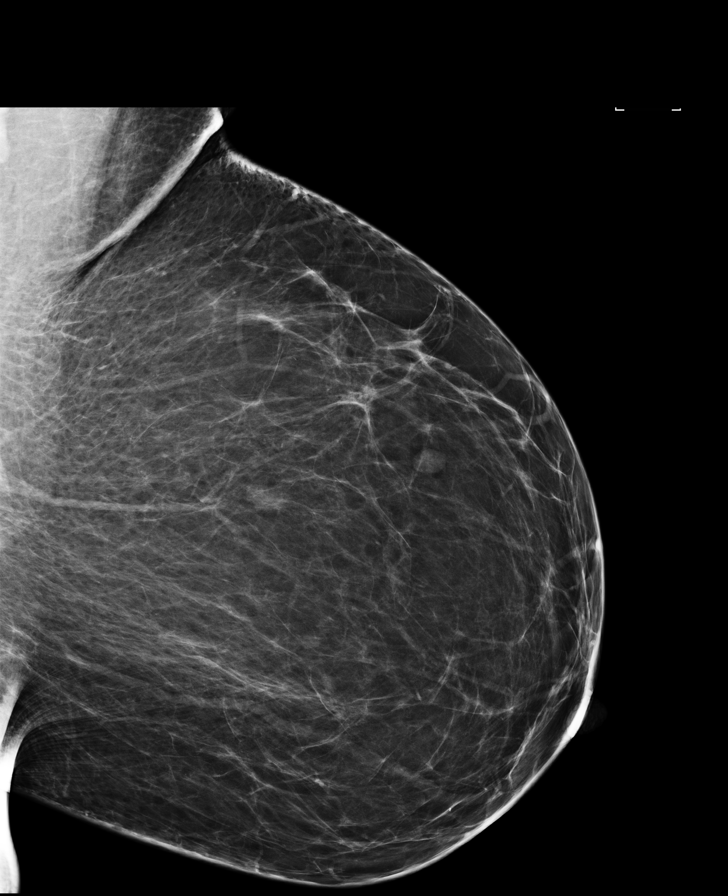

[R CC synth-2D]
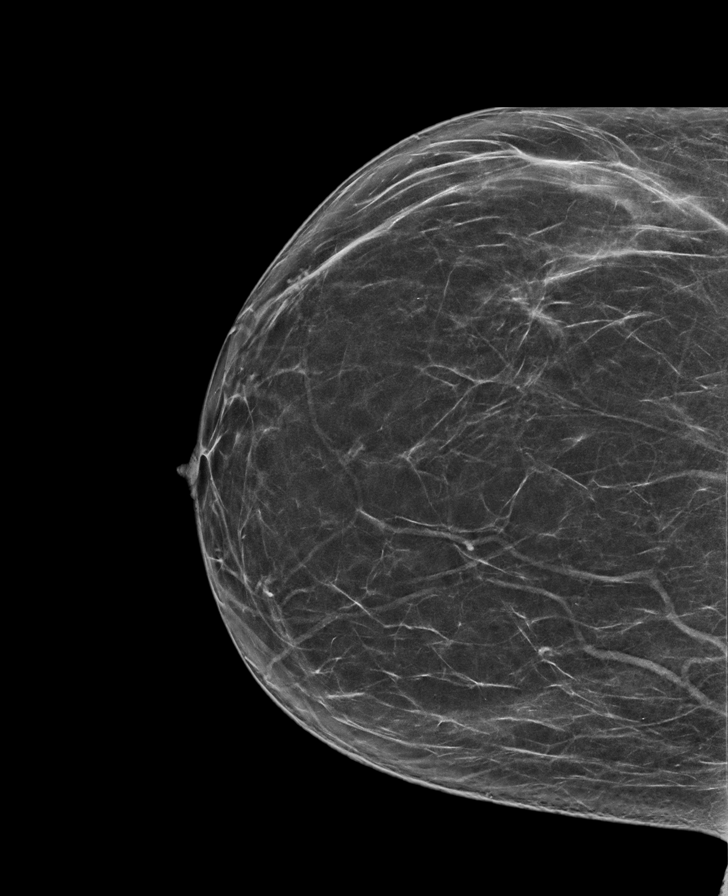

[8 of 28 positions shown; findings below may reference images not displayed]

ACR Breast Density Category b: There are scattered areas of
fibroglandular density.
FINDINGS: Stable rounded and oval circumscribed masses in both breasts. No
interval mammographic findings suspicious for malignancy in either
breast.

Mammographic images were processed with CAD.

On physical exam, no mass is palpable in the 12:30 o'clock position
of the left breast.

Targeted ultrasound is performed, showing a 9 x 8 x 4 mm oval,
horizontally oriented, circumscribed, hypoechoic mass in the 12:30
o'clock position of the left breast, 8 cm from the nipple. This
measured is 8 x 6 x 3 mm on 08/09/2015 and 7 x 6 x 4 mm on
02/04/2015.
IMPRESSION: 1. Minimal increase in size of a fibroadenoma in the 12:30 o'clock
position of the left breast over a 2.5 year period of time. The
minimal increase in size is compatible with a benign fibroadenoma.
2. No evidence of malignancy in either breast.

RECOMMENDATION:
Bilateral screening mammogram in 1 year.

I have discussed the findings and recommendations with the patient.
Results were also provided in writing at the conclusion of the
visit. If applicable, a reminder letter will be sent to the patient
regarding the next appointment.

BI-RADS CATEGORY  2: Benign.

## 2018-05-03 DIAGNOSIS — Z6838 Body mass index (BMI) 38.0-38.9, adult: Secondary | ICD-10-CM | POA: Diagnosis not present

## 2018-05-03 DIAGNOSIS — E785 Hyperlipidemia, unspecified: Secondary | ICD-10-CM | POA: Diagnosis not present

## 2018-05-03 DIAGNOSIS — E1165 Type 2 diabetes mellitus with hyperglycemia: Secondary | ICD-10-CM | POA: Diagnosis not present

## 2018-05-03 DIAGNOSIS — I10 Essential (primary) hypertension: Secondary | ICD-10-CM | POA: Diagnosis not present

## 2018-07-30 DIAGNOSIS — I10 Essential (primary) hypertension: Secondary | ICD-10-CM | POA: Diagnosis not present

## 2018-07-30 DIAGNOSIS — E782 Mixed hyperlipidemia: Secondary | ICD-10-CM | POA: Diagnosis not present

## 2018-07-30 DIAGNOSIS — E1165 Type 2 diabetes mellitus with hyperglycemia: Secondary | ICD-10-CM | POA: Diagnosis not present

## 2018-07-30 DIAGNOSIS — E785 Hyperlipidemia, unspecified: Secondary | ICD-10-CM | POA: Diagnosis not present

## 2018-08-03 DIAGNOSIS — E785 Hyperlipidemia, unspecified: Secondary | ICD-10-CM | POA: Diagnosis not present

## 2018-08-03 DIAGNOSIS — I10 Essential (primary) hypertension: Secondary | ICD-10-CM | POA: Diagnosis not present

## 2018-08-03 DIAGNOSIS — E1165 Type 2 diabetes mellitus with hyperglycemia: Secondary | ICD-10-CM | POA: Diagnosis not present

## 2018-08-03 DIAGNOSIS — E782 Mixed hyperlipidemia: Secondary | ICD-10-CM | POA: Diagnosis not present

## 2018-08-04 ENCOUNTER — Other Ambulatory Visit: Payer: Self-pay | Admitting: Family Medicine

## 2018-08-04 DIAGNOSIS — Z1231 Encounter for screening mammogram for malignant neoplasm of breast: Secondary | ICD-10-CM

## 2018-08-13 DIAGNOSIS — H5203 Hypermetropia, bilateral: Secondary | ICD-10-CM | POA: Diagnosis not present

## 2018-09-07 ENCOUNTER — Ambulatory Visit
Admission: RE | Admit: 2018-09-07 | Discharge: 2018-09-07 | Disposition: A | Discharge status: Home / Self Care | Payer: BLUE CROSS/BLUE SHIELD | Source: Ambulatory Visit | Attending: Family Medicine | Admitting: Family Medicine

## 2018-09-07 DIAGNOSIS — Z1231 Encounter for screening mammogram for malignant neoplasm of breast: Secondary | ICD-10-CM | POA: Diagnosis not present

## 2018-10-24 ENCOUNTER — Emergency Department (HOSPITAL_COMMUNITY): Payer: BLUE CROSS/BLUE SHIELD

## 2018-10-24 ENCOUNTER — Other Ambulatory Visit: Payer: Self-pay

## 2018-10-24 ENCOUNTER — Emergency Department (HOSPITAL_COMMUNITY)
Admission: EM | Admit: 2018-10-24 | Discharge: 2018-10-24 | Disposition: A | Discharge status: Home / Self Care | Payer: BLUE CROSS/BLUE SHIELD | Attending: Emergency Medicine | Admitting: Emergency Medicine

## 2018-10-24 ENCOUNTER — Encounter (HOSPITAL_COMMUNITY): Payer: Self-pay | Admitting: Emergency Medicine

## 2018-10-24 DIAGNOSIS — Z79899 Other long term (current) drug therapy: Secondary | ICD-10-CM | POA: Diagnosis not present

## 2018-10-24 DIAGNOSIS — E119 Type 2 diabetes mellitus without complications: Secondary | ICD-10-CM | POA: Diagnosis not present

## 2018-10-24 DIAGNOSIS — I10 Essential (primary) hypertension: Secondary | ICD-10-CM | POA: Insufficient documentation

## 2018-10-24 DIAGNOSIS — Z87891 Personal history of nicotine dependence: Secondary | ICD-10-CM | POA: Insufficient documentation

## 2018-10-24 DIAGNOSIS — J069 Acute upper respiratory infection, unspecified: Secondary | ICD-10-CM | POA: Insufficient documentation

## 2018-10-24 DIAGNOSIS — R05 Cough: Secondary | ICD-10-CM | POA: Diagnosis present

## 2018-10-24 MED ORDER — AZILSARTAN-CHLORTHALIDONE 40-25 MG PO TABS: 1.0000 | ORAL_TABLET | Freq: Every day | ORAL | 0 refills | Status: AC

## 2018-10-24 MED ORDER — ACETAMINOPHEN 325 MG PO TABS
650.0000 mg | ORAL_TABLET | Freq: Once | ORAL | Status: AC
  Administered 2018-10-24: 650 mg via ORAL
  Filled 2018-10-24: qty 2

## 2018-10-24 NOTE — ED Triage Notes (Signed)
Pt reports cold sx for the last 2 weeks. Yesterday noted eye swelling, sinus congestion and chest pain with coughing. Tried mucinex and alkasetzer cold plus medication.

## 2018-10-24 NOTE — ED Provider Notes (Signed)
[facility] DE[facility] Provider Note   CSN: [id number] Arrival date & time: 10/24/18  1147     History   Chief Complaint Chief Complaint  Patient presents with  . Nasal Congestion  . Cough    HPI Debra Mccormick is a 42 y.o. female.  The history is provided by the patient.  URI   This is a new problem. The current episode started more than 2 days ago. The problem has not changed since onset.The maximum temperature recorded prior to her arrival was 100 to 100.9 F. The fever has been present for less than 1 day. Associated symptoms include congestion and cough. Pertinent negatives include no chest pain and no sore throat. She has tried nothing for the symptoms. The treatment provided no relief.    Past Medical History:  Diagnosis Date  . Diabetes mellitus without complication ([place])   . Hypertension     Patient Active Problem List   Diagnosis Date Noted  . OBESITY, UNSPECIFIED 10/23/2010  . 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 HYPERTENSION 10/23/2010    Past Surgical History:  Procedure Laterality Date  . MYOMECTOMY    . VAGINAL HYSTERECTOMY       OB History    Gravida  3   Para  3   Term  3   Preterm      AB      Living  3     SAB      TAB      Ectopic      Multiple      Live Births               Home Medications    Prior to Admission medications   Medication Sig Start Date End Date Taking? Authorizing Provider  amLODipine (NORVASC) 5 MG tablet Take 5 mg by mouth daily. 02/11/14   [provider]  Azilsartan-Chlorthalidone (EDARBYCLOR) 40-25 MG TABS Take 1 Dose by mouth daily. 10/24/18 11/23/18  Dez Stauffer, DO  canagliflozin (INVOKANA) 300 MG TABS tablet Take 300 mg by mouth daily before breakfast.    [provider]  Cholecalciferol (VITAMIN D PO) Take 1 tablet by mouth daily as needed. Patient states she only takes when she thinks about it    [provider]  cyclobenzaprine (FLEXERIL) 10 MG tablet Take 1 tablet (10 mg total) by mouth 2 (two) times daily as needed for muscle spasms. Patient not taking: Reported on 08/11/2016 04/08/16   Dowless, Aldona Bar Tripp, PA-C  fluconazole (DIFLUCAN) 150 MG tablet Take 1 tablet (150 mg total) by mouth daily. Patient not taking: Reported on 08/11/2016 05/21/15   Fransico Meadow, PA-C  fluticasone [facility]) 50 MCG/ACT nasal spray Place 2 sprays into both nostrils daily. 12/15/17   Nils Flack, Mina A, PA-C  hydrochlorothiazide (HYDRODIURIL) 25 MG tablet Take 1 tablet (25 mg total) by mouth daily. Patient not taking: Reported on 08/11/2016 11/19/14   Lavonia Drafts, MD  lisinopril (PRINIVIL,ZESTRIL) 5 MG tablet Take 1 tablet (5 mg total) by mouth daily. 08/11/16   Duffy Bruce, MD  naproxen (NAPROSYN) 375 MG tablet Take 1 tablet (375 mg total) by mouth 2 (two) times daily as needed. Patient not taking: Reported on 08/11/2016 03/18/15   Robynn Pane, MD  naproxen (NAPROSYN) 500 MG tablet Take 1 tablet (500 mg total) by mouth 2 (two) times daily. Patient not taking: Reported on 08/11/2016 04/08/16   Dowless, Aldona Bar Tripp, PA-C  naproxen (NAPROSYN) 500 MG  tablet Take 500 mg by mouth 2 (two) times daily as needed for mild pain.    [provider]  rosuvastatin (CRESTOR) 10 MG tablet Take 10 mg by mouth daily.    [provider]    Family History History reviewed. No pertinent family history.  Social History Social History   Tobacco Use  . Smoking status: Former Research scientist (life sciences)  . Smokeless tobacco: Never Used  Substance Use Topics  . Alcohol use: Yes    Comment: occassional  . Drug use: No     Allergies   Orange fruit [citrus] and Tomato   Review of Systems Review of Systems  HENT: Positive for congestion and sinus pressure. Negative for sore throat.   Respiratory: Positive for cough. Negative for shortness of breath.   Cardiovascular: Negative for chest pain and palpitations.    Musculoskeletal: Positive for myalgias. Negative for back pain.     Physical Exam Updated Vital Signs  ED Triage Vitals  Enc Vitals Group     BP 10/24/18 1153 (!) 165/102     Pulse Rate 10/24/18 1153 (!) 108     Resp 10/24/18 1153 20     Temp 10/24/18 1153 99.2 F (37.3 C)     Temp Source 10/24/18 1153 Oral     SpO2 10/24/18 1153 96 %     Weight 10/24/18 1156 224 lb (101.6 kg)     Height 10/24/18 1156 5\' 6"  (1.676 m)     Head Circumference --      Peak Flow --      Pain Score 10/24/18 1156 2     Pain Loc --      Pain Edu? --      Excl. in [place]? --     Physical Exam Vitals signs and nursing note reviewed.  Constitutional:      General: She is not in acute distress.    Appearance: She is well-developed.  HENT:     Head: Normocephalic and atraumatic.     Right Ear: Tympanic membrane normal.     Left Ear: Tympanic membrane normal.     Nose: Nose normal.     Mouth/Throat:     Mouth: Mucous membranes are moist.  Eyes:     Conjunctiva/sclera: Conjunctivae normal.     Pupils: Pupils are equal, round, and reactive to light.  Neck:     Musculoskeletal: Neck supple.  Cardiovascular:     Rate and Rhythm: Normal rate and regular rhythm.     Pulses: Normal pulses.     Heart sounds: Normal heart sounds. No murmur.  Pulmonary:     Effort: Pulmonary effort is normal. No respiratory distress.     Breath sounds: Normal breath sounds.  Abdominal:     Palpations: Abdomen is soft.     Tenderness: There is no abdominal tenderness.  Skin:    General: Skin is warm and dry.  Neurological:     Mental Status: She is alert.      ED Treatments / Results  Labs (all labs ordered are listed, but only abnormal results are displayed) Labs Reviewed - No data to display  EKG None  Radiology Dg Chest 2 View  Result Date: 10/24/2018 CLINICAL DATA:  Cough and congestion with shortness of breath for 2 weeks EXAM: CHEST - 2 VIEW COMPARISON:  12/15/2017 FINDINGS: Normal heart size and  mediastinal contours. No acute infiltrate or edema. No effusion or pneumothorax. No acute osseous findings. IMPRESSION: Negative chest. Electronically Signed   By: Angelica Chessman  Watts M.D.   On: 10/24/2018 12:36    Procedures Procedures (including critical care time)  Medications Ordered in ED Medications  acetaminophen (TYLENOL) tablet 650 mg (650 mg Oral Given 10/24/18 1218)     Initial Impression / Assessment and Plan / ED Course  I have reviewed the triage vital signs and the nursing notes.  Pertinent labs & imaging results that were available during my care of the patient were reviewed by me and considered in my medical decision making (see chart for details).     Debra Mccormick is a 42 year old female with history of hypertension who presents to the ED with flulike illness.  Patient with unremarkable vitals.  No fever.  Patient with cough and congestion for the last several days.  Denies any chest pain, shortness of breath.  No signs of ear or throat infection on exam.  Clear breath sounds bilaterally.  Chest x-ray showed no signs of pneumonia, pneumothorax, pleural effusion.  Patient was given Tylenol for symptomatic care.  She appears well-hydrated.  Likely viral process.  Told to continue hydration, Tylenol, Motrin.  Refill blood pressure medicine for the patient.  Discharged in good condition.  Recommend follow-up with primary care doctor.  This chart was dictated using voice recognition software.  Despite best efforts to proofread,  errors can occur which can change the documentation meaning.   Final Clinical Impressions(s) / ED Diagnoses   Final diagnoses:  Upper respiratory tract infection, unspecified type    ED Discharge Orders         Ordered    Azilsartan-Chlorthalidone (EDARBYCLOR) 40-25 MG TABS  Daily     10/24/18 1313           Lennice Sites, DO 10/24/18 1313

## 2019-03-04 DIAGNOSIS — Z79899 Other long term (current) drug therapy: Secondary | ICD-10-CM | POA: Diagnosis not present

## 2019-03-04 DIAGNOSIS — E785 Hyperlipidemia, unspecified: Secondary | ICD-10-CM | POA: Diagnosis not present

## 2019-03-04 DIAGNOSIS — R252 Cramp and spasm: Secondary | ICD-10-CM | POA: Diagnosis not present

## 2019-03-04 DIAGNOSIS — I10 Essential (primary) hypertension: Secondary | ICD-10-CM | POA: Diagnosis not present

## 2019-03-04 DIAGNOSIS — L659 Nonscarring hair loss, unspecified: Secondary | ICD-10-CM | POA: Diagnosis not present

## 2019-03-04 DIAGNOSIS — E1165 Type 2 diabetes mellitus with hyperglycemia: Secondary | ICD-10-CM | POA: Diagnosis not present

## 2019-03-04 DIAGNOSIS — Z7984 Long term (current) use of oral hypoglycemic drugs: Secondary | ICD-10-CM | POA: Diagnosis not present

## 2019-03-06 DIAGNOSIS — E119 Type 2 diabetes mellitus without complications: Secondary | ICD-10-CM | POA: Diagnosis not present

## 2019-03-06 DIAGNOSIS — I10 Essential (primary) hypertension: Secondary | ICD-10-CM | POA: Diagnosis not present

## 2019-03-06 DIAGNOSIS — E782 Mixed hyperlipidemia: Secondary | ICD-10-CM | POA: Diagnosis not present

## 2019-03-06 DIAGNOSIS — L659 Nonscarring hair loss, unspecified: Secondary | ICD-10-CM | POA: Diagnosis not present

## 2019-03-22 ENCOUNTER — Other Ambulatory Visit: Payer: Self-pay

## 2019-03-22 ENCOUNTER — Emergency Department (HOSPITAL_COMMUNITY)
Admission: EM | Admit: 2019-03-22 | Discharge: 2019-03-22 | Disposition: A | Discharge status: Home / Self Care | Payer: BLUE CROSS/BLUE SHIELD | Attending: Emergency Medicine | Admitting: Emergency Medicine

## 2019-03-22 ENCOUNTER — Encounter (HOSPITAL_COMMUNITY): Payer: Self-pay | Admitting: Emergency Medicine

## 2019-03-22 DIAGNOSIS — R309 Painful micturition, unspecified: Secondary | ICD-10-CM | POA: Diagnosis not present

## 2019-03-22 DIAGNOSIS — N390 Urinary tract infection, site not specified: Secondary | ICD-10-CM | POA: Insufficient documentation

## 2019-03-22 DIAGNOSIS — R1030 Lower abdominal pain, unspecified: Secondary | ICD-10-CM | POA: Insufficient documentation

## 2019-03-22 DIAGNOSIS — I10 Essential (primary) hypertension: Secondary | ICD-10-CM | POA: Insufficient documentation

## 2019-03-22 DIAGNOSIS — Z87891 Personal history of nicotine dependence: Secondary | ICD-10-CM | POA: Diagnosis not present

## 2019-03-22 DIAGNOSIS — E119 Type 2 diabetes mellitus without complications: Secondary | ICD-10-CM | POA: Insufficient documentation

## 2019-03-22 DIAGNOSIS — R350 Frequency of micturition: Secondary | ICD-10-CM | POA: Diagnosis not present

## 2019-03-22 DIAGNOSIS — R300 Dysuria: Secondary | ICD-10-CM | POA: Diagnosis not present

## 2019-03-22 DIAGNOSIS — Z79899 Other long term (current) drug therapy: Secondary | ICD-10-CM | POA: Insufficient documentation

## 2019-03-22 LAB — URINALYSIS, ROUTINE W REFLEX MICROSCOPIC
Bilirubin Urine: NEGATIVE
Glucose, UA: >=500 mg/dL — AB
Ketones, ur: NEGATIVE mg/dL
Nitrite: NEGATIVE
Protein, ur: 100 mg/dL — AB
RBC / HPF: >50 RBC/hpf — ABNORMAL HIGH (ref 0–5)
Specific Gravity, Urine: 1.033 — ABNORMAL HIGH (ref 1.005–1.030)
WBC, UA: >50 WBC/hpf — ABNORMAL HIGH (ref 0–5)
pH: 6 (ref 5.0–8.0)

## 2019-03-22 LAB — WET PREP, GENITAL
Sperm: NONE SEEN
Trich, Wet Prep: NONE SEEN
Yeast Wet Prep HPF POC: NONE SEEN

## 2019-03-22 MED ORDER — CEPHALEXIN 500 MG PO CAPS: 500.0000 mg | ORAL_CAPSULE | Freq: Three times a day (TID) | ORAL | 0 refills | Status: DC

## 2019-03-22 MED ORDER — FLUCONAZOLE 150 MG PO TABS: 150.0000 mg | ORAL_TABLET | Freq: Every day | ORAL | 0 refills | Status: DC

## 2019-03-22 MED ORDER — ONDANSETRON 4 MG PO TBDP: 4.0000 mg | ORAL_TABLET | Freq: Three times a day (TID) | ORAL | 0 refills | Status: AC | PRN

## 2019-03-22 MED ORDER — CEPHALEXIN 500 MG PO CAPS: 500.0000 mg | ORAL_CAPSULE | Freq: Three times a day (TID) | ORAL | 0 refills | Status: AC

## 2019-03-22 MED ORDER — PHENAZOPYRIDINE HCL 200 MG PO TABS: 200.0000 mg | ORAL_TABLET | Freq: Three times a day (TID) | ORAL | 0 refills | Status: AC | PRN

## 2019-03-22 MED ORDER — FLUCONAZOLE 150 MG PO TABS: 150.0000 mg | ORAL_TABLET | Freq: Every day | ORAL | 0 refills | Status: AC

## 2019-03-22 MED ORDER — ONDANSETRON 4 MG PO TBDP: 4.0000 mg | ORAL_TABLET | Freq: Three times a day (TID) | ORAL | 0 refills | Status: DC | PRN

## 2019-03-22 MED ORDER — PHENAZOPYRIDINE HCL 200 MG PO TABS: 200.0000 mg | ORAL_TABLET | Freq: Three times a day (TID) | ORAL | 0 refills | Status: DC | PRN

## 2019-03-22 NOTE — ED Notes (Signed)
Pt complaining of abd/pelvic pain under belly button. States she has had an increase in urination over the past day and noted some blood spotting this morning. Pt post hysterectomy.

## 2019-03-22 NOTE — ED Provider Notes (Signed)
[facility] EMERGENCY DEPARTMENT Provider Note   CSN: [id number] Arrival date & time: 03/22/19  0531    History   Chief Complaint Chief Complaint  Patient presents with  . Abdominal Pain  . Vaginal Bleeding    HPI Debra Mccormick is a 43 y.o. female.     HPI  43 yo F here with dysuria, frequency. Pt states that her sx started yesterday with dysuria, burning with urination, and urinary frequency. She states that it feels like she has to urinate "all the time" even with small amounts of urine. She's had associated suprapubic sharp, stabbing pain that is worse w/ urination. It also feels like she has to have a bowel movement but has not had any diarrhea or constipation. No flank pain. No nausea, vomiting. No fever or chills. No vaginal bleeding or discharge. She did, however, notice a small amount of blood on her tissue paper after wiping. No other complaints. No high risk sexual activity. She is s/p partial hysterectomy.  Past Medical History:  Diagnosis Date  . Diabetes mellitus without complication ([place])   . Hypertension     Patient Active Problem List   Diagnosis Date Noted  . OBESITY, UNSPECIFIED 10/23/2010  . 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 HYPERTENSION 10/23/2010    Past Surgical History:  Procedure Laterality Date  . MYOMECTOMY    . VAGINAL HYSTERECTOMY       OB History    Gravida  3   Para  3   Term  3   Preterm      AB      Living  3     SAB      TAB      Ectopic      Multiple      Live Births               Home Medications    Prior to Admission medications   Medication Sig Start Date End Date Taking? Authorizing Provider  amLODipine (NORVASC) 5 MG tablet Take 5 mg by mouth daily. 02/11/14   [provider]  Azilsartan-Chlorthalidone (EDARBYCLOR) 40-25 MG TABS Take 1 Dose by mouth daily. 10/24/18 11/23/18  Curatolo, Adam, DO  canagliflozin (INVOKANA) 300 MG TABS tablet Take  300 mg by mouth daily before breakfast.    [provider]  cephALEXin (KEFLEX) 500 MG capsule Take 1 capsule (500 mg total) by mouth 3 (three) times daily for 7 days. 03/22/19 03/29/19  Duffy Bruce, MD  Cholecalciferol (VITAMIN D PO) Take 1 tablet by mouth daily as needed. Patient states she only takes when she thinks about it    [provider]  cyclobenzaprine (FLEXERIL) 10 MG tablet Take 1 tablet (10 mg total) by mouth 2 (two) times daily as needed for muscle spasms. Patient not taking: Reported on 08/11/2016 04/08/16   Dowless, Aldona Bar Tripp, PA-C  fluconazole (DIFLUCAN) 150 MG tablet Take 1 tablet (150 mg total) by mouth daily for 1 dose. 03/22/19 03/23/19  Duffy Bruce, MD  fluticasone (FLONASE) 50 MCG/ACT nasal spray Place 2 sprays into both nostrils daily. 12/15/17   Nils Flack, Mina A, PA-C  hydrochlorothiazide (HYDRODIURIL) 25 MG tablet Take 1 tablet (25 mg total) by mouth daily. Patient not taking: Reported on 08/11/2016 11/19/14   Lavonia Drafts, MD  lisinopril (PRINIVIL,ZESTRIL) 5 MG tablet Take 1 tablet (5 mg total) by mouth daily. 08/11/16   Duffy Bruce, MD  naproxen (NAPROSYN) 375 MG tablet Take 1 tablet (  375 mg total) by mouth 2 (two) times daily as needed. Patient not taking: Reported on 08/11/2016 03/18/15   Robynn Pane, MD  naproxen (NAPROSYN) 500 MG tablet Take 1 tablet (500 mg total) by mouth 2 (two) times daily. Patient not taking: Reported on 08/11/2016 04/08/16   Dowless, Aldona Bar Tripp, PA-C  naproxen (NAPROSYN) 500 MG tablet Take 500 mg by mouth 2 (two) times daily as needed for mild pain.    [provider]  ondansetron (ZOFRAN ODT) 4 MG disintegrating tablet Take 1 tablet (4 mg total) by mouth every 8 (eight) hours as needed for nausea or vomiting. 03/22/19   Duffy Bruce, MD  phenazopyridine (PYRIDIUM) 200 MG tablet Take 1 tablet (200 mg total) by mouth 3 (three) times daily as needed for pain. 03/22/19   Duffy Bruce, MD   rosuvastatin (CRESTOR) 10 MG tablet Take 10 mg by mouth daily.    [provider]    Family History No family history on file.  Social History Social History   Tobacco Use  . Smoking status: Former Research scientist (life sciences)  . Smokeless tobacco: Never Used  Substance Use Topics  . Alcohol use: Yes    Comment: occassional  . Drug use: No     Allergies   Orange fruit [citrus] and Tomato   Review of Systems Review of Systems  Constitutional: Positive for fatigue. Negative for chills and fever.  HENT: Negative for congestion and rhinorrhea.   Eyes: Negative for visual disturbance.  Respiratory: Negative for cough, shortness of breath and wheezing.   Cardiovascular: Negative for chest pain and leg swelling.  Gastrointestinal: Negative for abdominal pain, diarrhea, nausea and vomiting.  Genitourinary: Positive for dysuria, flank pain and frequency.  Musculoskeletal: Negative for neck pain and neck stiffness.  Skin: Negative for rash and wound.  Allergic/Immunologic: Negative for immunocompromised state.  Neurological: Negative for syncope, weakness and headaches.  All other systems reviewed and are negative.    Physical Exam Updated Vital Signs BP 129/88 (BP Location: Right Arm)   Pulse 86   Temp 97.7 F (36.5 C) (Oral)   Resp 17   Wt 103.9 kg   SpO2 99%   BMI 36.96 kg/m   Physical Exam Vitals signs and nursing note reviewed.  Constitutional:      General: She is not in acute distress.    Appearance: She is well-developed.  HENT:     Head: Normocephalic and atraumatic.  Eyes:     Conjunctiva/sclera: Conjunctivae normal.  Neck:     Musculoskeletal: Neck supple.  Cardiovascular:     Rate and Rhythm: Normal rate and regular rhythm.     Heart sounds: Normal heart sounds. No murmur. No friction rub.  Pulmonary:     Effort: Pulmonary effort is normal. No respiratory distress.     Breath sounds: Normal breath sounds. No wheezing or rales.  Abdominal:     General: There  is no distension.     Palpations: Abdomen is soft.     Tenderness: There is abdominal tenderness in the suprapubic area. There is no right CVA tenderness, left CVA tenderness, guarding or rebound.  Genitourinary:    Comments: Moderate amount thick white vaginal discharge. Cervix w/o lesions. No CMT. No adnexal TTP or fullness. Skin:    General: Skin is warm.     Capillary Refill: Capillary refill takes less than 2 seconds.  Neurological:     Mental Status: She is alert and oriented to person, place, and time.     Motor: No  abnormal muscle tone.      ED Treatments / Results  Labs (all labs ordered are listed, but only abnormal results are displayed) Labs Reviewed  WET PREP, GENITAL - Abnormal; Notable for the following components:      Result Value   Clue Cells Wet Prep HPF POC FEW (*)    WBC, Wet Prep HPF POC FEW (*)    All other components within normal limits  URINALYSIS, ROUTINE W REFLEX MICROSCOPIC - Abnormal; Notable for the following components:   APPearance HAZY (*)    Specific Gravity, Urine 1.033 (*)    Glucose, UA >=500 (*)    Hgb urine dipstick LARGE (*)    Protein, ur 100 (*)    Leukocytes,Ua TRACE (*)    RBC / HPF >50 (*)    WBC, UA >50 (*)    Bacteria, UA RARE (*)    All other components within normal limits  URINE CULTURE  GC/CHLAMYDIA PROBE AMP ([facility]) NOT AT [facility]    EKG None  Radiology No results found.  Procedures Procedures (including critical care time)  Medications Ordered in ED Medications - No data to display   Initial Impression / Assessment and Plan / ED Course  I have reviewed the triage vital signs and the nursing notes.  Pertinent labs & imaging results that were available during my care of the patient were reviewed by me and considered in my medical decision making (see chart for details).  Clinical Course as of Mar 22 823  Wed Mar 22, 2019  0727 43 yo F here with dysuria, frequency. Suspect uncomplicated UTI. No flank  pain, fever, nausea, vomiting, or signs of pyelo. She is s/p hysterectomy and pelvic exam unremarkable with no signs of significant cervicitis or cervical lesions. Will treat for UTI, send UCx as well as GC/C though less likely. Keflex, zofran prescribed.   [CI]  [id number] Few clue cells noted, adamant she has no vaginal discharge or irritation. Given that she'll be on ABX for UTI and to prevent adverse reactions with no sx of BV, will hold on Flagyl, advise PCP f/u if she does develop sx. Keflex, diflucan prescribed   [CI]    Clinical Course User Index [CI] Duffy Bruce, MD        Final Clinical Impressions(s) / ED Diagnoses   Final diagnoses:  Lower urinary tract infectious disease    ED Discharge Orders         Ordered    cephALEXin (KEFLEX) 500 MG capsule  3 times daily     03/22/19 0737    ondansetron (ZOFRAN ODT) 4 MG disintegrating tablet  Every 8 hours PRN     03/22/19 0737    phenazopyridine (PYRIDIUM) 200 MG tablet  3 times daily PRN     03/22/19 0737    fluconazole (DIFLUCAN) 150 MG tablet  Daily     03/22/19 4765           Duffy Bruce, MD 03/22/19 [phone]

## 2019-03-22 NOTE — ED Notes (Signed)
Patient verbalizes understanding of discharge instructions. Opportunity for questioning and answers were provided. Armband removed by staff, pt discharged from ED.  

## 2019-03-22 NOTE — ED Notes (Addendum)
Culture not sent with previous UA per lab. Pt just urinated, so will collect next time she urinates

## 2019-03-22 NOTE — ED Notes (Signed)
ED Provider at bedside. 

## 2019-03-22 NOTE — ED Triage Notes (Signed)
Pt in with c/o low abdominal pain since yesterday. States she began having vaginal spotting this am. Hx of partial hysterectomy 12'. Denies n/v/d

## 2019-03-23 LAB — GC/CHLAMYDIA PROBE AMP (~~LOC~~) NOT AT ARMC
Chlamydia: NEGATIVE
Neisseria Gonorrhea: NEGATIVE

## 2019-03-24 LAB — URINE CULTURE: Culture: >=100000 — AB

## 2019-03-25 ENCOUNTER — Telehealth: Payer: Self-pay | Admitting: Emergency Medicine

## 2019-03-25 NOTE — Telephone Encounter (Signed)
Post ED Visit - Positive Culture Follow-up  Culture report reviewed by antimicrobial stewardship pharmacist: [facility] []  Elenor Quinones, Pharm.D. []  Heide Guile, Pharm.D., BCPS AQ-ID []  Parks Neptune, Pharm.D., BCPS []  Alycia Rossetti, Pharm.D., BCPS []  [place], [place].D., BCPS, AAHIVP []  Legrand Como, Pharm.D., BCPS, AAHIVP [x]  Salome Arnt, PharmD, BCPS []  Johnnette Gourd, PharmD, BCPS []  Hughes Better, PharmD, BCPS []  Leeroy Cha, PharmD []  Laqueta Linden, PharmD, BCPS []  Albertina Parr, PharmD  [facility] Team []  Leodis Sias, PharmD []  Lindell Spar, PharmD []  Royetta Asal, PharmD []  Graylin Shiver, Rph []  Rema Fendt) Glennon Mac, PharmD []  Arlyn Dunning, PharmD []  Netta Cedars, PharmD []  Dia Sitter, PharmD []  Leone Haven, PharmD []  Gretta Arab, PharmD []  Theodis Shove, PharmD []  Peggyann Juba, PharmD []  Reuel Boom, PharmD   Positive urine culture Treated with Cephalexin, organism sensitive to the same and no further patient follow-up is required at this time.  Larene Beach Rilan Eiland 03/25/2019, 4:23 PM

## 2019-09-07 ENCOUNTER — Other Ambulatory Visit: Payer: Self-pay | Admitting: Family Medicine

## 2019-09-07 DIAGNOSIS — Z1231 Encounter for screening mammogram for malignant neoplasm of breast: Secondary | ICD-10-CM

## 2019-10-09 DIAGNOSIS — E782 Mixed hyperlipidemia: Secondary | ICD-10-CM | POA: Diagnosis not present

## 2019-10-09 DIAGNOSIS — E119 Type 2 diabetes mellitus without complications: Secondary | ICD-10-CM | POA: Diagnosis not present

## 2019-10-12 DIAGNOSIS — E1165 Type 2 diabetes mellitus with hyperglycemia: Secondary | ICD-10-CM | POA: Diagnosis not present

## 2019-10-12 DIAGNOSIS — E782 Mixed hyperlipidemia: Secondary | ICD-10-CM | POA: Diagnosis not present

## 2019-10-12 DIAGNOSIS — Z1231 Encounter for screening mammogram for malignant neoplasm of breast: Secondary | ICD-10-CM | POA: Diagnosis not present

## 2019-10-12 DIAGNOSIS — B379 Candidiasis, unspecified: Secondary | ICD-10-CM | POA: Diagnosis not present

## 2019-11-28 DIAGNOSIS — Z1231 Encounter for screening mammogram for malignant neoplasm of breast: Secondary | ICD-10-CM | POA: Diagnosis not present

## 2020-11-12 ENCOUNTER — Other Ambulatory Visit: Payer: Self-pay

## 2020-11-12 ENCOUNTER — Emergency Department (HOSPITAL_COMMUNITY)
Admission: EM | Admit: 2020-11-12 | Discharge: 2020-11-13 | Disposition: A | Discharge status: Home / Self Care | Payer: BC Managed Care – PPO | Attending: Emergency Medicine | Admitting: Emergency Medicine

## 2020-11-12 ENCOUNTER — Encounter (HOSPITAL_COMMUNITY): Payer: Self-pay | Admitting: *Deleted

## 2020-11-12 DIAGNOSIS — R519 Headache, unspecified: Secondary | ICD-10-CM | POA: Diagnosis not present

## 2020-11-12 DIAGNOSIS — R000 Tachycardia, unspecified: Secondary | ICD-10-CM | POA: Diagnosis not present

## 2020-11-12 DIAGNOSIS — Y92410 Unspecified street and highway as the place of occurrence of the external cause: Secondary | ICD-10-CM | POA: Diagnosis not present

## 2020-11-12 DIAGNOSIS — M79601 Pain in right arm: Secondary | ICD-10-CM | POA: Diagnosis not present

## 2020-11-12 DIAGNOSIS — M546 Pain in thoracic spine: Secondary | ICD-10-CM | POA: Diagnosis not present

## 2020-11-12 DIAGNOSIS — I10 Essential (primary) hypertension: Secondary | ICD-10-CM | POA: Insufficient documentation

## 2020-11-12 DIAGNOSIS — Z5321 Procedure and treatment not carried out due to patient leaving prior to being seen by health care provider: Secondary | ICD-10-CM | POA: Insufficient documentation

## 2020-11-12 DIAGNOSIS — M545 Low back pain, unspecified: Secondary | ICD-10-CM | POA: Diagnosis not present

## 2020-11-12 DIAGNOSIS — M542 Cervicalgia: Secondary | ICD-10-CM | POA: Diagnosis not present
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
## 2020-11-12 NOTE — ED Notes (Signed)
Patient states she is going to leave 

## 2020-11-25 DIAGNOSIS — E785 Hyperlipidemia, unspecified: Secondary | ICD-10-CM | POA: Diagnosis not present

## 2020-11-25 DIAGNOSIS — E1165 Type 2 diabetes mellitus with hyperglycemia: Secondary | ICD-10-CM | POA: Diagnosis not present

## 2020-11-25 DIAGNOSIS — Z7984 Long term (current) use of oral hypoglycemic drugs: Secondary | ICD-10-CM | POA: Diagnosis not present

## 2020-11-25 DIAGNOSIS — I10 Essential (primary) hypertension: Secondary | ICD-10-CM | POA: Diagnosis not present

## 2020-12-05 DIAGNOSIS — E1165 Type 2 diabetes mellitus with hyperglycemia: Secondary | ICD-10-CM | POA: Diagnosis not present

## 2020-12-05 DIAGNOSIS — E785 Hyperlipidemia, unspecified: Secondary | ICD-10-CM | POA: Diagnosis not present

## 2020-12-05 DIAGNOSIS — I10 Essential (primary) hypertension: Secondary | ICD-10-CM | POA: Diagnosis not present

## 2020-12-06 DIAGNOSIS — E1169 Type 2 diabetes mellitus with other specified complication: Secondary | ICD-10-CM | POA: Diagnosis not present

## 2020-12-06 DIAGNOSIS — I10 Essential (primary) hypertension: Secondary | ICD-10-CM | POA: Diagnosis not present

## 2020-12-06 DIAGNOSIS — E785 Hyperlipidemia, unspecified: Secondary | ICD-10-CM | POA: Diagnosis not present

## 2020-12-09 DIAGNOSIS — E785 Hyperlipidemia, unspecified: Secondary | ICD-10-CM | POA: Diagnosis not present

## 2020-12-09 DIAGNOSIS — E1169 Type 2 diabetes mellitus with other specified complication: Secondary | ICD-10-CM | POA: Diagnosis not present

## 2020-12-09 DIAGNOSIS — I10 Essential (primary) hypertension: Secondary | ICD-10-CM | POA: Diagnosis not present

## 2020-12-23 DIAGNOSIS — Z7984 Long term (current) use of oral hypoglycemic drugs: Secondary | ICD-10-CM | POA: Diagnosis not present

## 2020-12-23 DIAGNOSIS — E1165 Type 2 diabetes mellitus with hyperglycemia: Secondary | ICD-10-CM | POA: Diagnosis not present

## 2020-12-23 DIAGNOSIS — E785 Hyperlipidemia, unspecified: Secondary | ICD-10-CM | POA: Diagnosis not present

## 2020-12-23 DIAGNOSIS — I10 Essential (primary) hypertension: Secondary | ICD-10-CM | POA: Diagnosis not present

## 2021-01-20 DIAGNOSIS — E782 Mixed hyperlipidemia: Secondary | ICD-10-CM | POA: Diagnosis not present

## 2021-01-20 DIAGNOSIS — I10 Essential (primary) hypertension: Secondary | ICD-10-CM | POA: Diagnosis not present

## 2021-01-20 DIAGNOSIS — E1169 Type 2 diabetes mellitus with other specified complication: Secondary | ICD-10-CM | POA: Diagnosis not present

## 2021-01-20 DIAGNOSIS — E1165 Type 2 diabetes mellitus with hyperglycemia: Secondary | ICD-10-CM | POA: Diagnosis not present

## 2021-01-20 DIAGNOSIS — Z7984 Long term (current) use of oral hypoglycemic drugs: Secondary | ICD-10-CM | POA: Diagnosis not present

## 2021-01-23 DIAGNOSIS — I10 Essential (primary) hypertension: Secondary | ICD-10-CM | POA: Diagnosis not present

## 2021-01-23 DIAGNOSIS — Z7984 Long term (current) use of oral hypoglycemic drugs: Secondary | ICD-10-CM | POA: Diagnosis not present

## 2021-01-23 DIAGNOSIS — E785 Hyperlipidemia, unspecified: Secondary | ICD-10-CM | POA: Diagnosis not present

## 2021-01-23 DIAGNOSIS — E1165 Type 2 diabetes mellitus with hyperglycemia: Secondary | ICD-10-CM | POA: Diagnosis not present

## 2021-02-22 DIAGNOSIS — E785 Hyperlipidemia, unspecified: Secondary | ICD-10-CM | POA: Diagnosis not present

## 2021-02-22 DIAGNOSIS — E1165 Type 2 diabetes mellitus with hyperglycemia: Secondary | ICD-10-CM | POA: Diagnosis not present

## 2021-02-22 DIAGNOSIS — I10 Essential (primary) hypertension: Secondary | ICD-10-CM | POA: Diagnosis not present

## 2021-02-22 DIAGNOSIS — Z7984 Long term (current) use of oral hypoglycemic drugs: Secondary | ICD-10-CM | POA: Diagnosis not present

## 2021-02-28 DIAGNOSIS — E785 Hyperlipidemia, unspecified: Secondary | ICD-10-CM | POA: Diagnosis not present

## 2021-02-28 DIAGNOSIS — I10 Essential (primary) hypertension: Secondary | ICD-10-CM | POA: Diagnosis not present

## 2021-02-28 DIAGNOSIS — E1165 Type 2 diabetes mellitus with hyperglycemia: Secondary | ICD-10-CM | POA: Diagnosis not present

## 2021-03-03 DIAGNOSIS — I10 Essential (primary) hypertension: Secondary | ICD-10-CM | POA: Diagnosis not present

## 2021-03-03 DIAGNOSIS — E1169 Type 2 diabetes mellitus with other specified complication: Secondary | ICD-10-CM | POA: Diagnosis not present

## 2021-03-03 DIAGNOSIS — E785 Hyperlipidemia, unspecified: Secondary | ICD-10-CM | POA: Diagnosis not present

## 2021-05-24 ENCOUNTER — Emergency Department (HOSPITAL_COMMUNITY)
Admission: EM | Admit: 2021-05-24 | Discharge: 2021-05-25 | Disposition: A | Discharge status: Home / Self Care | Payer: BC Managed Care – PPO | Attending: Emergency Medicine | Admitting: Emergency Medicine

## 2021-05-24 ENCOUNTER — Emergency Department (HOSPITAL_COMMUNITY): Payer: BC Managed Care – PPO

## 2021-05-24 ENCOUNTER — Other Ambulatory Visit: Payer: Self-pay

## 2021-05-24 DIAGNOSIS — I10 Essential (primary) hypertension: Secondary | ICD-10-CM | POA: Diagnosis not present

## 2021-05-24 DIAGNOSIS — A599 Trichomoniasis, unspecified: Secondary | ICD-10-CM | POA: Diagnosis not present

## 2021-05-24 DIAGNOSIS — E119 Type 2 diabetes mellitus without complications: Secondary | ICD-10-CM | POA: Diagnosis not present

## 2021-05-24 DIAGNOSIS — N898 Other specified noninflammatory disorders of vagina: Secondary | ICD-10-CM | POA: Diagnosis not present

## 2021-05-24 DIAGNOSIS — Z79899 Other long term (current) drug therapy: Secondary | ICD-10-CM | POA: Diagnosis not present

## 2021-05-24 DIAGNOSIS — Z87891 Personal history of nicotine dependence: Secondary | ICD-10-CM | POA: Insufficient documentation

## 2021-05-24 DIAGNOSIS — R102 Pelvic and perineal pain: Secondary | ICD-10-CM

## 2021-05-24 DIAGNOSIS — Z90710 Acquired absence of both cervix and uterus: Secondary | ICD-10-CM | POA: Diagnosis not present

## 2021-05-24 DIAGNOSIS — R1030 Lower abdominal pain, unspecified: Secondary | ICD-10-CM | POA: Diagnosis not present

## 2021-05-24 LAB — URINALYSIS, ROUTINE W REFLEX MICROSCOPIC
Bacteria, UA: NONE SEEN
Bilirubin Urine: NEGATIVE
Glucose, UA: 50 mg/dL — AB
Hgb urine dipstick: NEGATIVE
Ketones, ur: 5 mg/dL — AB
Nitrite: NEGATIVE
Protein, ur: NEGATIVE mg/dL
Specific Gravity, Urine: 1.021 (ref 1.005–1.030)
pH: 5 (ref 5.0–8.0)

## 2021-05-24 LAB — WET PREP, GENITAL
Sperm: NONE SEEN
Yeast Wet Prep HPF POC: NONE SEEN

## 2021-05-24 LAB — CBC WITH DIFFERENTIAL/PLATELET
Abs Immature Granulocytes: 0.02 10*3/uL (ref 0.00–0.07)
Basophils Absolute: 0 10*3/uL (ref 0.0–0.1)
Basophils Relative: 1 %
Eosinophils Absolute: 0.1 10*3/uL (ref 0.0–0.5)
Eosinophils Relative: 2 %
HCT: 36.7 % (ref 36.0–46.0)
Hemoglobin: 11.9 g/dL — ABNORMAL LOW (ref 12.0–15.0)
Immature Granulocytes: 0 %
Lymphocytes Relative: 47 %
Lymphs Abs: 3.1 10*3/uL (ref 0.7–4.0)
MCH: 28.2 pg (ref 26.0–34.0)
MCHC: 32.4 g/dL (ref 30.0–36.0)
MCV: 87 fL (ref 80.0–100.0)
Monocytes Absolute: 0.6 10*3/uL (ref 0.1–1.0)
Monocytes Relative: 8 %
Neutro Abs: 2.8 10*3/uL (ref 1.7–7.7)
Neutrophils Relative %: 42 %
Platelets: 302 10*3/uL (ref 150–400)
RBC: 4.22 MIL/uL (ref 3.87–5.11)
RDW: 15.4 % (ref 11.5–15.5)
WBC: 6.7 10*3/uL (ref 4.0–10.5)
nRBC: 0 % (ref 0.0–0.2)

## 2021-05-24 LAB — COMPREHENSIVE METABOLIC PANEL
ALT: 27 U/L (ref 0–44)
AST: 23 U/L (ref 15–41)
Albumin: 3.9 g/dL (ref 3.5–5.0)
Alkaline Phosphatase: 42 U/L (ref 38–126)
Anion gap: 15 (ref 5–15)
BUN: 16 mg/dL (ref 6–20)
CO2: 23 mmol/L (ref 22–32)
Calcium: 10.6 mg/dL — ABNORMAL HIGH (ref 8.9–10.3)
Chloride: 98 mmol/L (ref 98–111)
Creatinine, Ser: 0.9 mg/dL (ref 0.44–1.00)
GFR, Estimated: >60 mL/min (ref >60)
Glucose, Bld: 230 mg/dL — ABNORMAL HIGH (ref 70–99)
Potassium: 3.5 mmol/L (ref 3.5–5.1)
Sodium: 136 mmol/L (ref 135–145)
Total Bilirubin: 0.6 mg/dL (ref 0.3–1.2)
Total Protein: 7.3 g/dL (ref 6.5–8.1)

## 2021-05-24 LAB — LIPASE, BLOOD: Lipase: 33 U/L (ref 11–51)

## 2021-05-24 MED ORDER — METRONIDAZOLE 500 MG PO TABS
2000.0000 mg | ORAL_TABLET | Freq: Once | ORAL | Status: AC
  Administered 2021-05-24: 2000 mg via ORAL
  Filled 2021-05-24: qty 4

## 2021-05-24 MED ORDER — CEFTRIAXONE SODIUM 500 MG IJ SOLR
500.0000 mg | Freq: Once | INTRAMUSCULAR | Status: AC
  Administered 2021-05-25: 500 mg via INTRAMUSCULAR
  Filled 2021-05-24: qty 500

## 2021-05-24 MED ORDER — LIDOCAINE HCL (PF) 1 % IJ SOLN
1.0000 mL | Freq: Once | INTRAMUSCULAR | Status: AC
  Administered 2021-05-25: 1 mL
  Filled 2021-05-24: qty 5

## 2021-05-24 MED ORDER — DOXYCYCLINE HYCLATE 100 MG PO CAPS: 100.0000 mg | ORAL_CAPSULE | Freq: Two times a day (BID) | ORAL | 0 refills | Status: AC

## 2021-05-24 MED ORDER — DOXYCYCLINE HYCLATE 100 MG PO TABS
100.0000 mg | ORAL_TABLET | Freq: Once | ORAL | Status: AC
  Administered 2021-05-25: 100 mg via ORAL
  Filled 2021-05-24: qty 1

## 2021-05-24 NOTE — ED Provider Notes (Signed)
Emergency Medicine Provider Triage Evaluation Note  Debra Mccormick , a 45 y.o. female  was evaluated in triage.  Pt complains of suprapubic abdominal pain that is intermittent x 1 day. No history of similar pain. Also with vaginal discharge that is yellow in color.  Pain is stabbing.  Pain is 7 out of 10 in severity.  surgical history includes hysterectomy.  Review of Systems  Positive: Abdominal pain, vaginal discharge Negative: Urinary symptoms, diarrhea, pelvic pain, fever, chills, back pain, chest pain, headache  Physical Exam  BP (!) 171/100 (BP Location: Right Arm)   Pulse 99   Temp 99.4 F (37.4 C) (Oral)   Resp 18   Ht '5\' 6"'$  (1.676 m)   Wt 97.1 kg   SpO2 98%   BMI 34.54 kg/m  Gen:   Awake, no distress   Resp:  Normal effort  MSK:   Moves extremities without difficulty  Other:  Suprapubic abdominal pain, no peritoneal signs.  No CVA tenderness.   Medical Decision Making  Medically screening exam initiated at 4:42 PM.  Appropriate orders placed.  [name] was informed that the remainder of the evaluation will be completed by another provider, this initial triage assessment does not replace that evaluation, and the importance of remaining in the ED until their evaluation is complete.  Patient concerned her BP is elevated. Abdominal labs and UA ordered.   Portions of this note were generated with [employer]. Dictation errors may occur despite best attempts at proofreading.    Barrie Folk, PA-C 05/24/21 1646    Wyvonnia Dusky, MD 05/25/21 [phone]

## 2021-05-24 NOTE — Discharge Instructions (Signed)
Your test for trichomonas was positive today.  This is a sexually transmitted infection.  You were treated for this in the ER.  You will need to inform your partner of the positive test and they will need to be treated as well.  You will need to abstain from sex for 2 weeks to prevent reinfection. You were also tested for gonorrhea and chlamydia.  These results are pending.  If positive, you have already received treatment and do not need further treatment, but your partner will.   Follow-up with OB/GYN for recheck of your symptoms. Return to the emergency room with any new, worsening, concerning symptoms.

## 2021-05-24 NOTE — ED Triage Notes (Signed)
Pt here POV with c/o stabbing abdominal pain and vaginal discharge X1 day. Denies N/V. No pain when urinating. Pain 7/10

## 2021-05-24 NOTE — ED Provider Notes (Signed)
[facility] EMERGENCY DEPARTMENT Provider Note   CSN: [id number] Arrival date & time: 05/24/21  1557     History Chief Complaint  Patient presents with   Abdominal Pain    Debra Mccormick is a 45 y.o. female presenting for evaluation of lower abdominal pain.  Patient states her symptoms began last night.  She reports there is a gradual onset of intermittent sharp lower abdominal pain.  She has associated vaginal discharge.  She denies fevers, chills, cough, chest pain, shortness breath, nausea, vomiting, urinary symptoms, abnormal bowel movements.  She is sexually active with 1 female partner, her husband.  They do not use condoms.  She had a partial hysterectomy.  She reports a history of diabetes and hypertension, no other medical problems.  She does not follow with OB/GYN  HPI     Past Medical History:  Diagnosis Date   Diabetes mellitus without complication ([place])    Hypertension     Patient Active Problem List   Diagnosis Date Noted   OBESITY, UNSPECIFIED 10/23/2010   IRON DEFICIENCY ANEMIA SECONDARY TO BLOOD LOSS 10/23/2010   HYPERTENSION 10/23/2010    Past Surgical History:  Procedure Laterality Date   MYOMECTOMY     VAGINAL HYSTERECTOMY       OB History     Gravida  3   Para  3   Term  3   Preterm      AB      Living  3      SAB      IAB      Ectopic      Multiple      Live Births              No family history on file.  Social History   Tobacco Use   Smoking status: Former   Smokeless tobacco: Never  Substance Use Topics   Alcohol use: Yes    Comment: occassional   Drug use: No    Home Medications Prior to Admission medications   Medication Sig Start Date End Date Taking? Authorizing Provider  doxycycline (VIBRAMYCIN) 100 MG capsule Take 1 capsule (100 mg total) by mouth 2 (two) times daily for 10 days. 05/24/21 06/03/21 Yes Averianna Brugger, PA-C  amLODipine (NORVASC) 5 MG tablet Take 5 mg by mouth daily.  02/11/14   [provider]  Azilsartan-Chlorthalidone (EDARBYCLOR) 40-25 MG TABS Take 1 Dose by mouth daily. 10/24/18 11/23/18  Curatolo, Adam, DO  canagliflozin (INVOKANA) 300 MG TABS tablet Take 300 mg by mouth daily before breakfast.    [provider]  Cholecalciferol (VITAMIN D PO) Take 1 tablet by mouth daily as needed. Patient states she only takes when she thinks about it    [provider]  cyclobenzaprine (FLEXERIL) 10 MG tablet Take 1 tablet (10 mg total) by mouth 2 (two) times daily as needed for muscle spasms. Patient not taking: Reported on 08/11/2016 04/08/16   Dowless, Aldona Bar Tripp, PA-C  fluticasone (FLONASE) 50 MCG/ACT nasal spray Place 2 sprays into both nostrils daily. 12/15/17   Nils Flack, Mina A, PA-C  hydrochlorothiazide (HYDRODIURIL) 25 MG tablet Take 1 tablet (25 mg total) by mouth daily. Patient not taking: Reported on 08/11/2016 11/19/14   Lavonia Drafts, MD  lisinopril (PRINIVIL,ZESTRIL) 5 MG tablet Take 1 tablet (5 mg total) by mouth daily. 08/11/16   Duffy Bruce, MD  naproxen (NAPROSYN) 375 MG tablet Take 1 tablet (375 mg total) by mouth 2 (two) times daily as needed.  Patient not taking: Reported on 08/11/2016 03/18/15   Robynn Pane, MD  naproxen (NAPROSYN) 500 MG tablet Take 1 tablet (500 mg total) by mouth 2 (two) times daily. Patient not taking: Reported on 08/11/2016 04/08/16   Dowless, Aldona Bar Tripp, PA-C  naproxen (NAPROSYN) 500 MG tablet Take 500 mg by mouth 2 (two) times daily as needed for mild pain.    [provider]  ondansetron (ZOFRAN ODT) 4 MG disintegrating tablet Take 1 tablet (4 mg total) by mouth every 8 (eight) hours as needed for nausea or vomiting. 03/22/19   Duffy Bruce, MD  phenazopyridine (PYRIDIUM) 200 MG tablet Take 1 tablet (200 mg total) by mouth 3 (three) times daily as needed for pain. 03/22/19   Duffy Bruce, MD  rosuvastatin (CRESTOR) 10 MG tablet Take 10 mg by mouth daily.    [provider]    Allergies    Orange fruit [citrus] and Tomato  Review of Systems   Review of Systems  Gastrointestinal:  Positive for abdominal pain.  Genitourinary:  Positive for vaginal discharge.  All other systems reviewed and are negative.  Physical Exam Updated Vital Signs BP (!) 133/99 (BP Location: Left Arm)   Pulse 84   Temp (!) 97.3 F (36.3 C) (Temporal)   Resp 16   Ht '5\' 6"'$  (1.676 m)   Wt 97.1 kg   SpO2 100%   BMI 34.54 kg/m   Physical Exam Vitals and nursing note reviewed. Exam conducted with a chaperone present.  Constitutional:      General: She is not in acute distress.    Appearance: Normal appearance. She is obese.  HENT:     Head: Normocephalic and atraumatic.  Eyes:     Conjunctiva/sclera: Conjunctivae normal.     Pupils: Pupils are equal, round, and reactive to light.  Cardiovascular:     Rate and Rhythm: Normal rate and regular rhythm.     Pulses: Normal pulses.  Pulmonary:     Effort: Pulmonary effort is normal. No respiratory distress.     Breath sounds: Normal breath sounds. No wheezing.     Comments: Speaking in full sentences.  Clear lung sounds in all fields. Abdominal:     General: There is no distension.     Palpations: Abdomen is soft. There is no mass.     Tenderness: There is abdominal tenderness in the suprapubic area. There is no guarding or rebound.  Genitourinary:    Vagina: Vaginal discharge present.     Adnexa:        Right: Tenderness present.      Comments: Uterus and cervix surgically absent.  Moderate amount of discharge noted on exam.  Right side and adnexal tenderness. Musculoskeletal:        General: Normal range of motion.     Cervical back: Normal range of motion and neck supple.  Skin:    General: Skin is warm and dry.     Capillary Refill: Capillary refill takes less than 2 seconds.  Neurological:     Mental Status: She is alert and oriented to person, place, and time.  Psychiatric:        Mood and Affect:  Mood and affect normal.        Speech: Speech normal.        Behavior: Behavior normal.    ED Results / Procedures / Treatments   Labs (all labs ordered are listed, but only abnormal results are displayed) Labs Reviewed  WET PREP, GENITAL - Abnormal;  Notable for the following components:      Result Value   Trich, Wet Prep PRESENT (*)    Clue Cells Wet Prep HPF POC PRESENT (*)    WBC, Wet Prep HPF POC MANY (*)    All other components within normal limits  COMPREHENSIVE METABOLIC PANEL - Abnormal; Notable for the following components:   Glucose, Bld 230 (*)    Calcium 10.6 (*)    All other components within normal limits  CBC WITH DIFFERENTIAL/PLATELET - Abnormal; Notable for the following components:   Hemoglobin 11.9 (*)    All other components within normal limits  URINALYSIS, ROUTINE W REFLEX MICROSCOPIC - Abnormal; Notable for the following components:   APPearance HAZY (*)    Glucose, UA 50 (*)    Ketones, ur 5 (*)    Leukocytes,Ua MODERATE (*)    Trichomonas, UA PRESENT (*)    All other components within normal limits  LIPASE, BLOOD  GC/CHLAMYDIA PROBE AMP ([place]) NOT AT [facility]    EKG None  Radiology US Transvaginal Non-OB  Result Date: 05/24/2021 CLINICAL DATA:  Pelvic pain and vaginal discharge today. Concern for tubo-ovarian abscess status post partial hysterectomy EXAM: TRANSABDOMINAL AND TRANSVAGINAL ULTRASOUND OF PELVIS TECHNIQUE: Both transabdominal and transvaginal ultrasound examinations of the pelvis were performed. Transabdominal technique was performed for global imaging of the pelvis including uterus, ovaries, adnexal regions, and pelvic cul-de-sac. It was necessary to proceed with endovaginal exam following the transabdominal exam to visualize the ovaries and adnexa. COMPARISON:  None FINDINGS: Uterus Surgically absent. Right ovary Measurements: 3.4 x 1.9 x 2.2 cm = volume: 7.2 mL. Blood flow is seen. No cyst or adnexal mass. There is a tiny echogenic  focus measuring 2-3 mm and may represent a calcification. No adnexal fluid collection. Left ovary Not visualized.  No adnexal mass or fluid collection. Other findings Trace simple free fluid in the dependent pelvis. IMPRESSION: 1. No sonographic evidence of tubo-ovarian abscess. No pelvic collection or mass demonstrated by ultrasound. 2. Normal sized right ovary with small echogenic focus that may represent a calcification, of doubtful clinical significance. 3. Left ovary is not visualized. 4. Hysterectomy. Electronically Signed   By: Keith Rake M.D.   On: 05/24/2021 23:45   US Pelvis Complete  Result Date: 05/24/2021 CLINICAL DATA:  Pelvic pain and vaginal discharge today. Concern for tubo-ovarian abscess status post partial hysterectomy EXAM: TRANSABDOMINAL AND TRANSVAGINAL ULTRASOUND OF PELVIS TECHNIQUE: Both transabdominal and transvaginal ultrasound examinations of the pelvis were performed. Transabdominal technique was performed for global imaging of the pelvis including uterus, ovaries, adnexal regions, and pelvic cul-de-sac. It was necessary to proceed with endovaginal exam following the transabdominal exam to visualize the ovaries and adnexa. COMPARISON:  None FINDINGS: Uterus Surgically absent. Right ovary Measurements: 3.4 x 1.9 x 2.2 cm = volume: 7.2 mL. Blood flow is seen. No cyst or adnexal mass. There is a tiny echogenic focus measuring 2-3 mm and may represent a calcification. No adnexal fluid collection. Left ovary Not visualized.  No adnexal mass or fluid collection. Other findings Trace simple free fluid in the dependent pelvis. IMPRESSION: 1. No sonographic evidence of tubo-ovarian abscess. No pelvic collection or mass demonstrated by ultrasound. 2. Normal sized right ovary with small echogenic focus that may represent a calcification, of doubtful clinical significance. 3. Left ovary is not visualized. 4. Hysterectomy. Electronically Signed   By: Keith Rake M.D.   On: 05/24/2021  23:45    Procedures Procedures   Medications Ordered in ED  Medications  cefTRIAXone (ROCEPHIN) injection 500 mg (has no administration in time range)  lidocaine (PF) (XYLOCAINE) 1 % injection 1 mL (has no administration in time range)  doxycycline (VIBRA-TABS) tablet 100 mg (has no administration in time range)  metroNIDAZOLE (FLAGYL) tablet 2,000 mg (2,000 mg Oral Given 05/24/21 2332)    ED Course  I have reviewed the triage vital signs and the nursing notes.  Pertinent labs & imaging results that were available during my care of the patient were reviewed by me and considered in my medical decision making (see chart for details).    MDM Rules/Calculators/A&P                           Patient presenting for evaluation of lower abdominal pain and vaginal discharge.  On exam, patient appears nontoxic.  She does have suprapubic tenderness.  GYN exam shows a moderate amount of discharge.  She also has right-sided adnexal tenderness.  Labs obtained in triage overall reassuring.  However patient with positive trichomoniasis in her urine and unilateral adnexal tenderness.  Will obtain ultrasound to rule out TOA/abscess.   Ultrasound negative for TOA.  Discussed findings with patient.  Discussed that she has been treated for STDs, and her partner will need to be treated.  Follow-up with GYN.  At this time, patient appears safe for discharge.  Return precautions given.  Patient states he understands and agrees to plan.   Final Clinical Impression(s) / ED Diagnoses Final diagnoses:  Trichomonosis  Vaginal discharge  Pelvic pain    Rx / DC Orders ED Discharge Orders          Ordered    doxycycline (VIBRAMYCIN) 100 MG capsule  2 times daily        05/24/21 2354             Johanthan Kneeland, PA-C 05/25/21 0001    Fredia Sorrow, MD 05/29/21 1246

## 2021-05-26 LAB — GC/CHLAMYDIA PROBE AMP (~~LOC~~) NOT AT ARMC
Chlamydia: NEGATIVE
Comment: NEGATIVE
Comment: NORMAL
Neisseria Gonorrhea: NEGATIVE

## 2021-07-04 ENCOUNTER — Other Ambulatory Visit: Payer: Self-pay

## 2021-07-04 ENCOUNTER — Ambulatory Visit (INDEPENDENT_AMBULATORY_CARE_PROVIDER_SITE_OTHER): Payer: BC Managed Care – PPO | Admitting: Obstetrics and Gynecology

## 2021-07-04 ENCOUNTER — Encounter: Payer: Self-pay | Admitting: Obstetrics and Gynecology

## 2021-07-04 ENCOUNTER — Other Ambulatory Visit (HOSPITAL_COMMUNITY)
Admission: RE | Admit: 2021-07-04 | Discharge: 2021-07-04 | Disposition: A | Discharge status: Home / Self Care | Payer: BC Managed Care – PPO | Source: Ambulatory Visit | Attending: Obstetrics and Gynecology | Admitting: Obstetrics and Gynecology

## 2021-07-04 VITALS — BP 154/90 | HR 92 | Ht 67.0 in | Wt 210.9 lb

## 2021-07-04 DIAGNOSIS — N879 Dysplasia of cervix uteri, unspecified: Secondary | ICD-10-CM

## 2021-07-04 DIAGNOSIS — Z124 Encounter for screening for malignant neoplasm of cervix: Secondary | ICD-10-CM | POA: Diagnosis not present

## 2021-07-04 DIAGNOSIS — Z8619 Personal history of other infectious and parasitic diseases: Secondary | ICD-10-CM | POA: Diagnosis not present

## 2021-07-04 DIAGNOSIS — N83201 Unspecified ovarian cyst, right side: Secondary | ICD-10-CM

## 2021-07-04 DIAGNOSIS — Z01419 Encounter for gynecological examination (general) (routine) without abnormal findings: Secondary | ICD-10-CM

## 2021-07-04 DIAGNOSIS — Z1239 Encounter for other screening for malignant neoplasm of breast: Secondary | ICD-10-CM

## 2021-07-04 DIAGNOSIS — Z113 Encounter for screening for infections with a predominantly sexual mode of transmission: Secondary | ICD-10-CM

## 2021-07-04 NOTE — Progress Notes (Signed)
Obstetrics and Gynecology Annual Patient Evaluation  Appointment Date: 07/04/2021  OBGYN Clinic: [facility]  Primary Care Provider: Lucianne Lei Chief Complaint:  Chief Complaint  Patient presents with   Gynecologic Exam    History of Present Illness: Debra Mccormick is a 45 y.o. African-American G3P3003 (No LMP recorded. Patient has had a hysterectomy.), seen for the above chief complaint. Her past medical history is significant for h/o TVH in 2012 for bleeding, h/o pre-hyst LSIL pap, HTN, DM2.  Patient dx with trich on 7/30 in the ED and is s/p treatment. They did an u/s for pain and saw a small RO echogenic focus that is likely benign. Pt currently asymptomatic.   Pt only had a TVH but states she's had hot flashes and night sweats since the surgery; she denies any vaginal dryness.   Review of Systems: A comprehensive review of systems was negative.    Patient Active Problem List   Diagnosis Date Noted   OBESITY, UNSPECIFIED 10/23/2010   HYPERTENSION 10/23/2010    Past Medical History:  Past Medical History:  Diagnosis Date   Diabetes mellitus without complication ([place])    Hypertension     Past Surgical History:  Past Surgical History:  Procedure Laterality Date   MYOMECTOMY     VAGINAL HYSTERECTOMY  2012   for bleeding. benign path    Past Obstetrical History:  OB History  Gravida Para Term Preterm AB Living  '3 3 3     3  '$ SAB IAB Ectopic Multiple Live Births               # Outcome Date GA Lbr Len/2nd Weight Sex Delivery Anes PTL Lv  3 Term           2 Term           1 Term             Past Gynecological History: As per HPI. History of HRT use: No.  Social History:  Social History   Socioeconomic History   Marital status: Married    Spouse name: Not on file   Number of children: Not on file   Years of education: Not on file   Highest education level: Not on file  Occupational History   Not on file  Tobacco  Use   Smoking status: Former   Smokeless tobacco: Never  Substance and Sexual Activity   Alcohol use: Yes    Comment: occassional   Drug use: No   Sexual activity: Yes    Birth control/protection: Surgical  Other Topics Concern   Not on file  Social History Narrative   Not on file   Social Determinants of Health   Financial Resource Strain: Not on file  Food Insecurity: Not on file  Transportation Needs: Not on file  Physical Activity: Not on file  Stress: Not on file  Social Connections: Not on file  Intimate Partner Violence: Not on file    Family History:  She denies any female cancers  Health Maintenance:  Mammogram(s): Yes.   Date: 2019. negative  Medications Gaylin A. Cuello had no medications administered during this visit. Current Outpatient Medications  Medication Sig Dispense Refill   amLODipine (NORVASC) 5 MG tablet Take 5 mg by mouth daily.     chlorthalidone (HYGROTON) 25 MG tablet Take 25 mg by mouth daily.     metFORMIN (GLUCOPHAGE-XR) 500 MG 24 hr tablet Take 2,000 mg by mouth every morning.  rosuvastatin (CRESTOR) 10 MG tablet Take 10 mg by mouth daily.     Azilsartan-Chlorthalidone (EDARBYCLOR) 40-25 MG TABS Take 1 Dose by mouth daily. 30 tablet 0   canagliflozin (INVOKANA) 300 MG TABS tablet Take 300 mg by mouth daily before breakfast. (Patient not taking: Reported on 07/04/2021)     Cholecalciferol (VITAMIN D PO) Take 1 tablet by mouth daily as needed. Patient states she only takes when she thinks about it (Patient not taking: Reported on 07/04/2021)     cyclobenzaprine (FLEXERIL) 10 MG tablet Take 1 tablet (10 mg total) by mouth 2 (two) times daily as needed for muscle spasms. (Patient not taking: No sig reported) 20 tablet 0   fluticasone (FLONASE) 50 MCG/ACT nasal spray Place 2 sprays into both nostrils daily. (Patient not taking: Reported on 07/04/2021) 16 g 0   hydrochlorothiazide (HYDRODIURIL) 25 MG tablet Take 1 tablet (25 mg total) by mouth daily.  (Patient not taking: No sig reported) 30 tablet 3   lisinopril (PRINIVIL,ZESTRIL) 5 MG tablet Take 1 tablet (5 mg total) by mouth daily. (Patient not taking: Reported on 07/04/2021) 30 tablet 0   naproxen (NAPROSYN) 375 MG tablet Take 1 tablet (375 mg total) by mouth 2 (two) times daily as needed. (Patient not taking: No sig reported) 20 tablet 0   naproxen (NAPROSYN) 500 MG tablet Take 1 tablet (500 mg total) by mouth 2 (two) times daily. (Patient not taking: No sig reported) 30 tablet 0   naproxen (NAPROSYN) 500 MG tablet Take 500 mg by mouth 2 (two) times daily as needed for mild pain. (Patient not taking: Reported on 07/04/2021)     ondansetron (ZOFRAN ODT) 4 MG disintegrating tablet Take 1 tablet (4 mg total) by mouth every 8 (eight) hours as needed for nausea or vomiting. (Patient not taking: Reported on 07/04/2021) 12 tablet 0   phenazopyridine (PYRIDIUM) 200 MG tablet Take 1 tablet (200 mg total) by mouth 3 (three) times daily as needed for pain. (Patient not taking: Reported on 07/04/2021) 6 tablet 0   telmisartan (MICARDIS) 80 MG tablet Take 80 mg by mouth every morning. (Patient not taking: Reported on 07/04/2021)     No current facility-administered medications for this visit.    Allergies Orange fruit [citrus] and Tomato   Physical Exam:  BP (!) 154/90   Pulse 92   Ht '5\' 7"'$  (1.702 m)   Wt 210 lb 14.4 oz (95.7 kg)   BMI 33.03 kg/m  Body mass index is 33.03 kg/m.  General appearance: Well nourished, well developed female in no acute distress.  Neck:  Supple, normal appearance, and no thyromegaly  Cardiovascular: normal s1 and s2.  No murmurs, rubs or gallops. Respiratory:  Clear to auscultation bilateral. Normal respiratory effort Abdomen: positive bowel sounds and no masses, hernias; diffusely non tender to palpation, non distended Breasts: breasts appear normal, no suspicious masses, no skin or nipple changes or axillary nodes, and normal palpation. Neuro/Psych:  Normal mood and  affect.  Skin:  Warm and dry.  Lymphatic:  No inguinal lymphadenopathy.   Pelvic exam: is not limited by body habitus EGBUS: within normal limits, mild to moderate atrophy Vagina: within normal limits and with no blood or discharge in the vault. Mild to moderate atrophy Cuff: normal, nttp Bimanual exam: negative suprapubic area Adnexa:  normal adnexa and no mass, fullness, tenderness Rectovaginal: deferred  Laboratory: none  Radiology:  Narrative & Impression  CLINICAL DATA:  Pelvic pain and vaginal discharge today. Concern for tubo-ovarian abscess status  post partial hysterectomy   EXAM: TRANSABDOMINAL AND TRANSVAGINAL ULTRASOUND OF PELVIS   TECHNIQUE: Both transabdominal and transvaginal ultrasound examinations of the pelvis were performed. Transabdominal technique was performed for global imaging of the pelvis including uterus, ovaries, adnexal regions, and pelvic cul-de-sac. It was necessary to proceed with endovaginal exam following the transabdominal exam to visualize the ovaries and adnexa.   COMPARISON:  None   FINDINGS: Uterus   Surgically absent.   Right ovary   Measurements: 3.4 x 1.9 x 2.2 cm = volume: 7.2 mL. Blood flow is seen. No cyst or adnexal mass. There is a tiny echogenic focus measuring 2-3 mm and may represent a calcification. No adnexal fluid collection.   Left ovary   Not visualized.  No adnexal mass or fluid collection.   Other findings   Trace simple free fluid in the dependent pelvis.   IMPRESSION: 1. No sonographic evidence of tubo-ovarian abscess. No pelvic collection or mass demonstrated by ultrasound. 2. Normal sized right ovary with small echogenic focus that may represent a calcification, of doubtful clinical significance. 3. Left ovary is not visualized. 4. Hysterectomy.     Electronically Signed   By: Keith Rake M.D.   On: 05/24/2021 23:45    Assessment: pt doing well  Plan:  1. Cervical cancer  screening She has LSIL 2011 and then a TVH for bleeding in 2012 with benign pathology. I told her I recommend 20 years of paps from the [facility]. If this one is negative then repeat in 5 years and that can be her last one - Cytology - PAP( [place])  2. Breast screening Pt told to let us know if she desires mammogram  3. Screen for STD (sexually transmitted disease) Toc today - Cervicovaginal ancillary only( [place])  4. History of trichomoniasis - Cervicovaginal ancillary only( [place])  5. Well woman exam Lubrication recommended for any vaginal dryness. Pt told to let us know any bleeding or spotting s/s.  - Cervicovaginal ancillary only( [place]) - Cytology - PAP( [place])  6. RO cyst I told her likely benign but recommend rpt u/s in about 6 weeks to follow.   RTC call pt after u/s   Durene Romans MD Attending Center [facility])

## 2021-07-07 LAB — CERVICOVAGINAL ANCILLARY ONLY
Bacterial Vaginitis (gardnerella): NEGATIVE
Candida Glabrata: POSITIVE — AB
Candida Vaginitis: NEGATIVE
Chlamydia: NEGATIVE
Comment: NEGATIVE
Comment: NEGATIVE
Comment: NEGATIVE
Comment: NEGATIVE
Comment: NEGATIVE
Comment: NORMAL
Neisseria Gonorrhea: NEGATIVE
Trichomonas: NEGATIVE

## 2021-07-10 ENCOUNTER — Ambulatory Visit (HOSPITAL_COMMUNITY): Payer: BC Managed Care – PPO

## 2021-07-10 LAB — CYTOLOGY - PAP
Comment: NEGATIVE
Diagnosis: NEGATIVE
High risk HPV: NEGATIVE

## 2021-07-10 MED ORDER — MICONAZOLE NITRATE 2 % VA CREA: 1.0000 | TOPICAL_CREAM | Freq: Every day | VAGINAL | 2 refills | Status: AC

## 2021-07-10 NOTE — Addendum Note (Signed)
Addended by: Aletha Halim on: 07/10/2021 08:33 AM   Modules accepted: Orders

## 2021-07-18 ENCOUNTER — Ambulatory Visit (HOSPITAL_COMMUNITY)
Admission: RE | Admit: 2021-07-18 | Discharge: 2021-07-18 | Disposition: A | Discharge status: Home / Self Care | Payer: BC Managed Care – PPO | Source: Ambulatory Visit | Attending: Obstetrics and Gynecology | Admitting: Obstetrics and Gynecology

## 2021-07-18 DIAGNOSIS — N838 Other noninflammatory disorders of ovary, fallopian tube and broad ligament: Secondary | ICD-10-CM | POA: Diagnosis not present

## 2021-07-18 DIAGNOSIS — N83201 Unspecified ovarian cyst, right side: Secondary | ICD-10-CM | POA: Insufficient documentation

## 2021-07-18 DIAGNOSIS — Z90710 Acquired absence of both cervix and uterus: Secondary | ICD-10-CM | POA: Diagnosis not present

## 2021-07-25 DIAGNOSIS — Z7984 Long term (current) use of oral hypoglycemic drugs: Secondary | ICD-10-CM | POA: Diagnosis not present

## 2021-07-25 DIAGNOSIS — E1165 Type 2 diabetes mellitus with hyperglycemia: Secondary | ICD-10-CM | POA: Diagnosis not present

## 2021-07-25 DIAGNOSIS — E785 Hyperlipidemia, unspecified: Secondary | ICD-10-CM | POA: Diagnosis not present

## 2021-07-25 DIAGNOSIS — I10 Essential (primary) hypertension: Secondary | ICD-10-CM | POA: Diagnosis not present

## 2021-08-25 DIAGNOSIS — E785 Hyperlipidemia, unspecified: Secondary | ICD-10-CM | POA: Diagnosis not present

## 2021-08-25 DIAGNOSIS — E1165 Type 2 diabetes mellitus with hyperglycemia: Secondary | ICD-10-CM | POA: Diagnosis not present

## 2021-08-25 DIAGNOSIS — I10 Essential (primary) hypertension: Secondary | ICD-10-CM | POA: Diagnosis not present

## 2022-07-05 ENCOUNTER — Other Ambulatory Visit: Payer: Self-pay

## 2022-07-05 ENCOUNTER — Encounter (HOSPITAL_COMMUNITY): Payer: Self-pay

## 2022-07-05 ENCOUNTER — Emergency Department (HOSPITAL_COMMUNITY): Payer: No Typology Code available for payment source

## 2022-07-05 ENCOUNTER — Emergency Department (HOSPITAL_COMMUNITY)
Admission: EM | Admit: 2022-07-05 | Discharge: 2022-07-05 | Disposition: A | Discharge status: Home / Self Care | Payer: No Typology Code available for payment source | Attending: Emergency Medicine | Admitting: Emergency Medicine

## 2022-07-05 DIAGNOSIS — E1165 Type 2 diabetes mellitus with hyperglycemia: Secondary | ICD-10-CM | POA: Diagnosis not present

## 2022-07-05 DIAGNOSIS — W010XXA Fall on same level from slipping, tripping and stumbling without subsequent striking against object, initial encounter: Secondary | ICD-10-CM | POA: Diagnosis not present

## 2022-07-05 DIAGNOSIS — M25552 Pain in left hip: Secondary | ICD-10-CM | POA: Diagnosis not present

## 2022-07-05 DIAGNOSIS — Z7984 Long term (current) use of oral hypoglycemic drugs: Secondary | ICD-10-CM | POA: Diagnosis not present

## 2022-07-05 DIAGNOSIS — R739 Hyperglycemia, unspecified: Secondary | ICD-10-CM

## 2022-07-05 DIAGNOSIS — Y990 Civilian activity done for income or pay: Secondary | ICD-10-CM | POA: Insufficient documentation

## 2022-07-05 DIAGNOSIS — S42255A Nondisplaced fracture of greater tuberosity of left humerus, initial encounter for closed fracture: Secondary | ICD-10-CM | POA: Diagnosis not present

## 2022-07-05 DIAGNOSIS — S4992XA Unspecified injury of left shoulder and upper arm, initial encounter: Secondary | ICD-10-CM | POA: Diagnosis present

## 2022-07-05 LAB — CBC WITH DIFFERENTIAL/PLATELET
Abs Immature Granulocytes: 0.05 10*3/uL (ref 0.00–0.07)
Basophils Absolute: 0 10*3/uL (ref 0.0–0.1)
Basophils Relative: 0 %
Eosinophils Absolute: 0.1 10*3/uL (ref 0.0–0.5)
Eosinophils Relative: 1 %
HCT: 37 % (ref 36.0–46.0)
Hemoglobin: 11.8 g/dL — ABNORMAL LOW (ref 12.0–15.0)
Immature Granulocytes: 1 %
Lymphocytes Relative: 36 %
Lymphs Abs: 1.7 10*3/uL (ref 0.7–4.0)
MCH: 28.4 pg (ref 26.0–34.0)
MCHC: 31.9 g/dL (ref 30.0–36.0)
MCV: 88.9 fL (ref 80.0–100.0)
Monocytes Absolute: 0.4 10*3/uL (ref 0.1–1.0)
Monocytes Relative: 9 %
Neutro Abs: 2.5 10*3/uL (ref 1.7–7.7)
Neutrophils Relative %: 53 %
Platelets: 206 10*3/uL (ref 150–400)
RBC: 4.16 MIL/uL (ref 3.87–5.11)
RDW: 14.3 % (ref 11.5–15.5)
WBC: 4.7 10*3/uL (ref 4.0–10.5)
nRBC: 0 % (ref 0.0–0.2)

## 2022-07-05 LAB — BASIC METABOLIC PANEL
Anion gap: 11 (ref 5–15)
BUN: 14 mg/dL (ref 6–20)
CO2: 24 mmol/L (ref 22–32)
Calcium: 10.1 mg/dL (ref 8.9–10.3)
Chloride: 104 mmol/L (ref 98–111)
Creatinine, Ser: 0.87 mg/dL (ref 0.44–1.00)
GFR, Estimated: >60 mL/min (ref >60)
Glucose, Bld: 369 mg/dL — ABNORMAL HIGH (ref 70–99)
Potassium: 3.6 mmol/L (ref 3.5–5.1)
Sodium: 139 mmol/L (ref 135–145)

## 2022-07-05 LAB — CBG MONITORING, ED: Glucose-Capillary: 374 mg/dL — ABNORMAL HIGH (ref 70–99)

## 2022-07-05 MED ORDER — HYDROCODONE-ACETAMINOPHEN 5-325 MG PO TABS
1.0000 | ORAL_TABLET | Freq: Once | ORAL | Status: AC
  Administered 2022-07-05: 1 via ORAL
  Filled 2022-07-05: qty 1

## 2022-07-05 MED ORDER — LACTATED RINGERS IV BOLUS
1000.0000 mL | Freq: Once | INTRAVENOUS | Status: AC
  Administered 2022-07-05: 1000 mL via INTRAVENOUS

## 2022-07-05 MED ORDER — HYDROCODONE-ACETAMINOPHEN 5-325 MG PO TABS: 1.0000 | ORAL_TABLET | ORAL | 0 refills | Status: DC | PRN

## 2022-07-05 NOTE — ED Triage Notes (Signed)
Pt BIB [facility] coming from her place of employment. Pt tripped over a rug and presents with injury to L shoulder/arm injury. EMS report deformity to proximal humerus. Pt also having L hip pain. EMS admin 100 mcg of fentanyl.   EMS Vitals  CBG 451 181/112 HR 106 SpO2 96% on R/A

## 2022-07-05 NOTE — Discharge Instructions (Addendum)
Call the orthopedic office first thing in the morning to set up an appointment in about 1 week.  You may take ibuprofen and Tylenol for pain and apply ice to the shoulder.  If your pain is still uncontrolled then use the hydrocodone prescribed.  Do not drive or operate heavy machinery while on this.  If you develop numbness or weakness or intractable pain in the arm, or any other new/concerning symptoms then return to the ER for evaluation.

## 2022-07-05 NOTE — ED Provider Notes (Signed)
[facility] DEPT Provider Note   CSN: [id number] Arrival date & time: 07/05/22  1055     History  Chief Complaint  Patient presents with   Debra Mccormick is a 46 y.o. female.  HPI 46 year old female with a history of diabetes presents after a trip and fall.  She was at work and tripped over a rug and fell on her left side.  Primarily landed on her shoulder but also landed on her left hip and injured herself with the keys in her pocket.  She was told not to get up so she has not tried to bear weight.  She thinks he might of hit her head but denies any headache, LOC, or neck pain.  Pain is most severe in her shoulder.  No numbness or weakness.  She was put in a sling by EMS.  She was given IV fentanyl by EMS and pain is a lot better.  Was noted to be hyperglycemic with EMS.  She reports she did not yet take her medicine this morning and has had a cappuccino which she thinks has caused that.  She denies any dizziness or generalized weakness.   Home Medications Prior to Admission medications   Medication Sig Start Date End Date Taking? Authorizing Provider  HYDROcodone-acetaminophen (NORCO) 5-325 MG tablet Take 1 tablet by mouth every 4 (four) hours as needed. 07/05/22  Yes Sherwood Gambler, MD  amLODipine (NORVASC) 5 MG tablet Take 5 mg by mouth daily. 02/11/14   [provider]  Azilsartan-Chlorthalidone (EDARBYCLOR) 40-25 MG TABS Take 1 Dose by mouth daily. 10/24/18 11/23/18  Lennice Sites, DO  canagliflozin (INVOKANA) 300 MG TABS tablet Take 300 mg by mouth daily before breakfast. Patient not taking: Reported on 07/04/2021    [provider]  chlorthalidone (HYGROTON) 25 MG tablet Take 25 mg by mouth daily.    [provider]  Cholecalciferol (VITAMIN D PO) Take 1 tablet by mouth daily as needed. Patient states she only takes when she thinks about it Patient not taking: Reported on 07/04/2021    [provider]   cyclobenzaprine (FLEXERIL) 10 MG tablet Take 1 tablet (10 mg total) by mouth 2 (two) times daily as needed for muscle spasms. Patient not taking: No sig reported 04/08/16   Dowless, Aldona Bar Tripp, PA-C  fluticasone (FLONASE) 50 MCG/ACT nasal spray Place 2 sprays into both nostrils daily. Patient not taking: Reported on 07/04/2021 12/15/17   Rodell Perna A, PA-C  hydrochlorothiazide (HYDRODIURIL) 25 MG tablet Take 1 tablet (25 mg total) by mouth daily. Patient not taking: No sig reported 11/19/14   Lavonia Drafts, MD  lisinopril (PRINIVIL,ZESTRIL) 5 MG tablet Take 1 tablet (5 mg total) by mouth daily. Patient not taking: Reported on 07/04/2021 08/11/16   Duffy Bruce, MD  metFORMIN (GLUCOPHAGE-XR) 500 MG 24 hr tablet Take 2,000 mg by mouth every morning. 05/15/21   [provider]  miconazole (MONISTAT 7) 2 % vaginal cream Place 1 Applicatorful vaginally at bedtime. Apply for seven nights 07/10/21   Aletha Halim, MD  naproxen (NAPROSYN) 375 MG tablet Take 1 tablet (375 mg total) by mouth 2 (two) times daily as needed. Patient not taking: No sig reported 03/18/15   Robynn Pane, MD  naproxen (NAPROSYN) 500 MG tablet Take 1 tablet (500 mg total) by mouth 2 (two) times daily. Patient not taking: No sig reported 04/08/16   Dowless, Aldona Bar Tripp, PA-C  naproxen (NAPROSYN) 500 MG tablet Take 500 mg by mouth  2 (two) times daily as needed for mild pain. Patient not taking: Reported on 07/04/2021    [provider]  ondansetron (ZOFRAN ODT) 4 MG disintegrating tablet Take 1 tablet (4 mg total) by mouth every 8 (eight) hours as needed for nausea or vomiting. Patient not taking: Reported on 07/04/2021 03/22/19   Duffy Bruce, MD  phenazopyridine (PYRIDIUM) 200 MG tablet Take 1 tablet (200 mg total) by mouth 3 (three) times daily as needed for pain. Patient not taking: Reported on 07/04/2021 03/22/19   Duffy Bruce, MD  rosuvastatin (CRESTOR) 10 MG tablet Take 10 mg by mouth daily.     [provider]  telmisartan (MICARDIS) 80 MG tablet Take 80 mg by mouth every morning. Patient not taking: Reported on 07/04/2021 05/15/21   [provider]      Allergies    Orange fruit [citrus] and Tomato    Review of Systems   Review of Systems  Musculoskeletal:  Positive for arthralgias.  Neurological:  Negative for weakness, numbness and headaches.    Physical Exam Updated Vital Signs BP (!) 162/102   Pulse 81   Temp 98.7 F (37.1 C) (Oral)   Resp (!) 21   Ht '5\' 7"'$  (1.702 m)   Wt 101.6 kg   SpO2 100%   BMI 35.08 kg/m  Physical Exam Vitals and nursing note reviewed.  Constitutional:      Appearance: She is well-developed.  HENT:     Head: Normocephalic and atraumatic.  Cardiovascular:     Rate and Rhythm: Normal rate and regular rhythm.     Pulses:          Radial pulses are 2+ on the left side.       Dorsalis pedis pulses are 2+ on the left side.  Pulmonary:     Effort: Pulmonary effort is normal.  Abdominal:     General: There is no distension.  Musculoskeletal:     Left shoulder: Tenderness present. No deformity. Decreased range of motion.     Left upper arm: No deformity.     Left wrist: No tenderness. Normal range of motion.     Left hand: No tenderness. Normal range of motion.     Left hip: Tenderness (lateral hip) present. Normal range of motion.     Left knee: Normal range of motion.     Comments: Normal grip strength/sensation in left hand.  Skin:    General: Skin is warm and dry.  Neurological:     Mental Status: She is alert.     ED Results / Procedures / Treatments   Labs (all labs ordered are listed, but only abnormal results are displayed) Labs Reviewed  BASIC METABOLIC PANEL - Abnormal; Notable for the following components:      Result Value   Glucose, Bld 369 (*)    All other components within normal limits  CBC WITH DIFFERENTIAL/PLATELET - Abnormal; Notable for the following components:   Hemoglobin 11.8 (*)     All other components within normal limits  CBG MONITORING, ED - Abnormal; Notable for the following components:   Glucose-Capillary 374 (*)    All other components within normal limits    EKG None  Radiology DG Hip Unilat With Pelvis 2-3 Views Left  Result Date: 07/05/2022 CLINICAL DATA:  Fall with left hip pain. EXAM: DG HIP (WITH OR WITHOUT PELVIS) 2-3V LEFT COMPARISON:  None Available. FINDINGS: There is no evidence of hip fracture or dislocation. There is no evidence of arthropathy  or other focal bone abnormality. IMPRESSION: Negative. Electronically Signed   By: Marin Olp M.D.   On: 07/05/2022 11:51   DG Shoulder Left  Result Date: 07/05/2022 CLINICAL DATA:  Fall with left shoulder pain. EXAM: LEFT SHOULDER - 2+ VIEW COMPARISON:  04/30/2022 FINDINGS: No evidence of dislocation. AC joint and glenohumeral joints are normal. There is focal lucency and irregularity along the cortex at the greater tuberosity suggesting a superficial focal impaction injury. Remainder the exam is unremarkable. IMPRESSION: Focal lucency irregularity along the cortex at the greater tuberosity suggesting superficial impaction injury. Electronically Signed   By: Marin Olp M.D.   On: 07/05/2022 11:50    Procedures Procedures    Medications Ordered in ED Medications  lactated ringers bolus 1,000 mL (0 mLs Intravenous Stopped 07/05/22 1230)  HYDROcodone-acetaminophen (NORCO/VICODIN) 5-325 MG per tablet 1 tablet (1 tablet Oral Given 07/05/22 1305)    ED Course/ Medical Decision Making/ A&P                           Medical Decision Making Amount and/or Complexity of Data Reviewed Labs: ordered.    Details: Hyperglycemia without acidosis. Radiology: ordered and independent interpretation performed.    Details: Greater tuberosity humerus fracture, no dislocation.  No hip fracture.  Risk Prescription drug management.   Patient was given IV pain control by EMS.  Here she is neurovascular intact.   Seems like a trip and fall.  She is pretty hyperglycemic so labs were obtained but no acidosis to suggest DKA.  Pain is well controlled.  Given a dose of hydrocodone here and placed in a sling for the humerus fracture.  Follow-up with orthopedics.  Was able to ambulate without difficulty so I highly doubt an occult hip fracture.  No significant head injury or other injury.  Discharged home with return precautions.        Final Clinical Impression(s) / ED Diagnoses Final diagnoses:  Closed nondisplaced fracture of greater tuberosity of left humerus, initial encounter  Hyperglycemia    Rx / DC Orders ED Discharge Orders          Ordered    HYDROcodone-acetaminophen (NORCO) 5-325 MG tablet  Every 4 hours PRN        07/05/22 1350              Sherwood Gambler, MD 07/05/22 1519

## 2022-07-05 NOTE — ED Notes (Signed)
Pt ambulated to restroom with even, steady gait

## 2022-07-05 NOTE — ED Notes (Signed)
Pt teaching provided on medications that may cause drowsiness. Pt instructed not to drive or operate heavy machinery while taking the prescribed medication. Pt verbalized understanding.   Pt provided discharge instructions and prescription information. Pt was given the opportunity to ask questions and questions were answered.   

## 2022-07-05 NOTE — ED Notes (Signed)
ED Provider at bedside. 

## 2022-10-03 ENCOUNTER — Ambulatory Visit (HOSPITAL_COMMUNITY): Payer: BC Managed Care – PPO | Attending: Internal Medicine

## 2022-10-03 ENCOUNTER — Encounter (HOSPITAL_COMMUNITY): Payer: Self-pay | Admitting: *Deleted

## 2022-10-03 ENCOUNTER — Ambulatory Visit (HOSPITAL_COMMUNITY)
Admission: EM | Admit: 2022-10-03 | Discharge: 2022-10-03 | Disposition: A | Discharge status: Home / Self Care | Payer: Worker's Compensation | Attending: Internal Medicine | Admitting: Internal Medicine

## 2022-10-03 DIAGNOSIS — M25512 Pain in left shoulder: Secondary | ICD-10-CM | POA: Diagnosis not present

## 2022-10-03 MED ORDER — IBUPROFEN 800 MG PO TABS
ORAL_TABLET | ORAL | Status: AC
  Filled 2022-10-03: qty 1

## 2022-10-03 MED ORDER — IBUPROFEN 800 MG PO TABS
800.0000 mg | ORAL_TABLET | Freq: Once | ORAL | Status: AC
  Administered 2022-10-03: 800 mg via ORAL
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
## 2022-10-03 NOTE — Discharge Instructions (Addendum)
Your x-rays of your shoulder were negative for fracture or dislocation.   Please rest, ice, and elevate your shoulder to help it heal and decrease inflammation.   Take '600mg'$  ibuprofen every 6 hours or tylenol 1,000 every 6 hours as needed for pain. If needed, you can alternate these medications so that you take one medication every 3 hours. For instance, at noon take ibuprofen, then at 3pm take tylenol, then at 6pm take ibuprofen.   Call the orthopedic provider listed on your discharge paperwork to schedule a follow-up appointment if your symptoms do not improve in the next 1-2 weeks with supportive care.  Return to urgent care if you experience worsening pain, numbness, tingling, change of color in your skin near the injury, or any other concerning symptoms.  I hope you feel better!
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
## 2023-05-07 ENCOUNTER — Other Ambulatory Visit: Payer: Self-pay

## 2023-05-07 ENCOUNTER — Emergency Department (HOSPITAL_BASED_OUTPATIENT_CLINIC_OR_DEPARTMENT_OTHER): Payer: BC Managed Care – PPO

## 2023-05-07 ENCOUNTER — Emergency Department (HOSPITAL_BASED_OUTPATIENT_CLINIC_OR_DEPARTMENT_OTHER)
Admission: EM | Admit: 2023-05-07 | Discharge: 2023-05-07 | Disposition: A | Discharge status: Home / Self Care | Payer: BC Managed Care – PPO | Attending: Emergency Medicine | Admitting: Emergency Medicine

## 2023-05-07 ENCOUNTER — Encounter (HOSPITAL_BASED_OUTPATIENT_CLINIC_OR_DEPARTMENT_OTHER): Payer: Self-pay

## 2023-05-07 DIAGNOSIS — Z79899 Other long term (current) drug therapy: Secondary | ICD-10-CM | POA: Insufficient documentation

## 2023-05-07 DIAGNOSIS — D649 Anemia, unspecified: Secondary | ICD-10-CM | POA: Diagnosis not present

## 2023-05-07 DIAGNOSIS — E119 Type 2 diabetes mellitus without complications: Secondary | ICD-10-CM | POA: Insufficient documentation

## 2023-05-07 DIAGNOSIS — R1030 Lower abdominal pain, unspecified: Secondary | ICD-10-CM | POA: Diagnosis present

## 2023-05-07 DIAGNOSIS — Z7984 Long term (current) use of oral hypoglycemic drugs: Secondary | ICD-10-CM | POA: Diagnosis not present

## 2023-05-07 DIAGNOSIS — N3090 Cystitis, unspecified without hematuria: Secondary | ICD-10-CM | POA: Diagnosis not present

## 2023-05-07 DIAGNOSIS — I10 Essential (primary) hypertension: Secondary | ICD-10-CM | POA: Diagnosis not present

## 2023-05-07 LAB — URINALYSIS, W/ REFLEX TO CULTURE (INFECTION SUSPECTED)
Bilirubin Urine: NEGATIVE
Glucose, UA: NEGATIVE mg/dL
Ketones, ur: NEGATIVE mg/dL
Leukocytes,Ua: NEGATIVE
Nitrite: NEGATIVE
Protein, ur: NEGATIVE mg/dL
Specific Gravity, Urine: 1.015 (ref 1.005–1.030)
pH: 6 (ref 5.0–8.0)

## 2023-05-07 LAB — CBC WITH DIFFERENTIAL/PLATELET
Abs Immature Granulocytes: 0.02 10*3/uL (ref 0.00–0.07)
Basophils Absolute: 0 10*3/uL (ref 0.0–0.1)
Basophils Relative: 0 %
Eosinophils Absolute: 0.1 10*3/uL (ref 0.0–0.5)
Eosinophils Relative: 1 %
HCT: 33.5 % — ABNORMAL LOW (ref 36.0–46.0)
Hemoglobin: 10.7 g/dL — ABNORMAL LOW (ref 12.0–15.0)
Immature Granulocytes: 0 %
Lymphocytes Relative: 46 %
Lymphs Abs: 2.9 10*3/uL (ref 0.7–4.0)
MCH: 27.2 pg (ref 26.0–34.0)
MCHC: 31.9 g/dL (ref 30.0–36.0)
MCV: 85 fL (ref 80.0–100.0)
Monocytes Absolute: 0.5 10*3/uL (ref 0.1–1.0)
Monocytes Relative: 9 %
Neutro Abs: 2.8 10*3/uL (ref 1.7–7.7)
Neutrophils Relative %: 44 %
Platelets: 238 10*3/uL (ref 150–400)
RBC: 3.94 MIL/uL (ref 3.87–5.11)
RDW: 14.9 % (ref 11.5–15.5)
WBC: 6.4 10*3/uL (ref 4.0–10.5)
nRBC: 0 % (ref 0.0–0.2)

## 2023-05-07 LAB — COMPREHENSIVE METABOLIC PANEL
ALT: 27 U/L (ref 0–44)
AST: 22 U/L (ref 15–41)
Albumin: 3.7 g/dL (ref 3.5–5.0)
Alkaline Phosphatase: 37 U/L — ABNORMAL LOW (ref 38–126)
Anion gap: 9 (ref 5–15)
BUN: 13 mg/dL (ref 6–20)
CO2: 25 mmol/L (ref 22–32)
Calcium: 9.5 mg/dL (ref 8.9–10.3)
Chloride: 104 mmol/L (ref 98–111)
Creatinine, Ser: 0.89 mg/dL (ref 0.44–1.00)
GFR, Estimated: >60 mL/min (ref >60)
Glucose, Bld: 183 mg/dL — ABNORMAL HIGH (ref 70–99)
Potassium: 3.6 mmol/L (ref 3.5–5.1)
Sodium: 138 mmol/L (ref 135–145)
Total Bilirubin: 1 mg/dL (ref 0.3–1.2)
Total Protein: 7.1 g/dL (ref 6.5–8.1)

## 2023-05-07 LAB — LIPASE, BLOOD: Lipase: 38 U/L (ref 11–51)

## 2023-05-07 MED ORDER — IOHEXOL 300 MG/ML  SOLN
100.0000 mL | Freq: Once | INTRAMUSCULAR | Status: AC | PRN
Start: 2023-05-07 — End: 2023-05-07
  Administered 2023-05-07: 100 mL via INTRAVENOUS

## 2023-05-07 MED ORDER — ACETAMINOPHEN 325 MG PO TABS
650.0000 mg | ORAL_TABLET | Freq: Once | ORAL | Status: AC
  Administered 2023-05-07: 650 mg via ORAL
  Filled 2023-05-07: qty 2

## 2023-05-07 MED ORDER — NITROFURANTOIN MONOHYD MACRO 100 MG PO CAPS: 100.0000 mg | ORAL_CAPSULE | Freq: Two times a day (BID) | ORAL | 0 refills | Status: AC

## 2023-05-07 NOTE — Discharge Instructions (Signed)
I am treating you for a possible urinary tract infection.  Follow-up with your primary care physician if you are not improving with the antibiotics.  If you develop fever, new or worsening abdominal pain, vomiting, or any other new/concerning symptoms then return to the ER or call 911.

## 2023-05-07 NOTE — ED Triage Notes (Signed)
Pt endorses lower abd pain that feels like a pressure and thinks it may be a UTI. PT reports pressure with urination. NO hematuria. No N/D/V.

## 2023-05-07 NOTE — ED Provider Notes (Signed)
[facility] [facility] Provider Note   CSN: [id number] Arrival date & time: 05/07/23  0636     History  Chief Complaint  Patient presents with   Abdominal Pain    Debra Mccormick is a 47 y.o. female.  HPI 47 year old female with a history of hypertension and diabetes presents with concern for urinary tract infection.  Symptoms started yesterday.  She has lower abdominal pressure that worsens with urination.  She does not have a specific burning but her pressure worsens when she urinates and she is urinating smaller amounts more frequently.  Feels similar to 1 prior UTIs she has had.  No fevers, flank pain, back pain, vomiting, or diarrhea.  No vaginal symptoms.  Home Medications Prior to Admission medications   Medication Sig Start Date End Date Taking? Authorizing Provider  nitrofurantoin, macrocrystal-monohydrate, (MACROBID) 100 MG capsule Take 1 capsule (100 mg total) by mouth 2 (two) times daily. 05/07/23  Yes Pricilla Loveless, MD  amLODipine (NORVASC) 5 MG tablet Take 5 mg by mouth daily. 02/11/14   [provider]  Azilsartan-Chlorthalidone (EDARBYCLOR) 40-25 MG TABS Take 1 Dose by mouth daily. 10/24/18 11/23/18  Virgina Norfolk, DO  canagliflozin (INVOKANA) 300 MG TABS tablet Take 300 mg by mouth daily before breakfast. Patient not taking: Reported on 07/04/2021    [provider]  chlorthalidone (HYGROTON) 25 MG tablet Take 25 mg by mouth daily.    [provider]  Cholecalciferol (VITAMIN D PO) Take 1 tablet by mouth daily as needed. Patient states she only takes when she thinks about it Patient not taking: Reported on 07/04/2021    [provider]  cyclobenzaprine (FLEXERIL) 10 MG tablet Take 1 tablet (10 mg total) by mouth 2 (two) times daily as needed for muscle spasms. Patient not taking: Reported on 08/11/2016 04/08/16   Dowless, Lelon Mast Tripp, PA-C  fluticasone (FLONASE) 50 MCG/ACT nasal spray Place 2  sprays into both nostrils daily. Patient not taking: Reported on 07/04/2021 12/15/17   Michela Pitcher A, PA-C  hydrochlorothiazide (HYDRODIURIL) 25 MG tablet Take 1 tablet (25 mg total) by mouth daily. Patient not taking: Reported on 08/11/2016 11/19/14   Willodean Rosenthal, MD  HYDROcodone-acetaminophen (NORCO) 5-325 MG tablet Take 1 tablet by mouth every 4 (four) hours as needed. 07/05/22   Pricilla Loveless, MD  lisinopril (PRINIVIL,ZESTRIL) 5 MG tablet Take 1 tablet (5 mg total) by mouth daily. Patient not taking: Reported on 07/04/2021 08/11/16   Shaune Pollack, MD  metFORMIN (GLUCOPHAGE-XR) 500 MG 24 hr tablet Take 2,000 mg by mouth every morning. 05/15/21   [provider]  miconazole (MONISTAT 7) 2 % vaginal cream Place 1 Applicatorful vaginally at bedtime. Apply for seven nights 07/10/21   [place] Bing, MD  naproxen (NAPROSYN) 375 MG tablet Take 1 tablet (375 mg total) by mouth 2 (two) times daily as needed. Patient not taking: Reported on 08/11/2016 03/18/15   Bridgett Larsson, MD  naproxen (NAPROSYN) 500 MG tablet Take 1 tablet (500 mg total) by mouth 2 (two) times daily. Patient not taking: Reported on 08/11/2016 04/08/16   Dowless, Lelon Mast Tripp, PA-C  naproxen (NAPROSYN) 500 MG tablet Take 500 mg by mouth 2 (two) times daily as needed for mild pain. Patient not taking: Reported on 07/04/2021    [provider]  ondansetron (ZOFRAN ODT) 4 MG disintegrating tablet Take 1 tablet (4 mg total) by mouth every 8 (eight) hours as needed for nausea or vomiting. Patient not taking: Reported on 07/04/2021 03/22/19  Shaune Pollack, MD  phenazopyridine (PYRIDIUM) 200 MG tablet Take 1 tablet (200 mg total) by mouth 3 (three) times daily as needed for pain. Patient not taking: Reported on 07/04/2021 03/22/19   Shaune Pollack, MD  rosuvastatin (CRESTOR) 10 MG tablet Take 10 mg by mouth daily.    [provider]  telmisartan (MICARDIS) 80 MG tablet Take 80 mg by mouth every  morning. Patient not taking: Reported on 07/04/2021 05/15/21   [provider]      Allergies    Orange fruit [citrus] and Tomato    Review of Systems   Review of Systems  Constitutional:  Negative for fever.  Gastrointestinal:  Positive for abdominal pain. Negative for diarrhea and vomiting.  Genitourinary:  Positive for frequency and urgency. Negative for dysuria, flank pain and hematuria.  Musculoskeletal:  Negative for back pain.    Physical Exam Updated Vital Signs BP (!) 132/93   Pulse 67   Temp 98.5 F (36.9 C) (Oral)   Resp 17   Ht 5\' 7"  (1.702 m)   Wt 102.1 kg   SpO2 98%   BMI 35.24 kg/m  Physical Exam Vitals and nursing note reviewed.  Constitutional:      General: She is not in acute distress.    Appearance: She is well-developed. She is not ill-appearing or diaphoretic.  HENT:     Head: Normocephalic and atraumatic.  Cardiovascular:     Rate and Rhythm: Normal rate and regular rhythm.     Heart sounds: Normal heart sounds.  Pulmonary:     Effort: Pulmonary effort is normal.  Abdominal:     Palpations: Abdomen is soft.     Tenderness: There is abdominal tenderness in the suprapubic area.  Skin:    General: Skin is warm and dry.  Neurological:     Mental Status: She is alert.     ED Results / Procedures / Treatments   Labs (all labs ordered are listed, but only abnormal results are displayed) Labs Reviewed  URINALYSIS, W/ REFLEX TO CULTURE (INFECTION SUSPECTED) - Abnormal; Notable for the following components:      Result Value   Hgb urine dipstick TRACE (*)    Bacteria, UA RARE (*)    All other components within normal limits  CBC WITH DIFFERENTIAL/PLATELET - Abnormal; Notable for the following components:   Hemoglobin 10.7 (*)    HCT 33.5 (*)    All other components within normal limits  COMPREHENSIVE METABOLIC PANEL - Abnormal; Notable for the following components:   Glucose, Bld 183 (*)    Alkaline Phosphatase 37 (*)    All other  components within normal limits  LIPASE, BLOOD    EKG None  Radiology CT ABDOMEN PELVIS W CONTRAST  Result Date: 05/07/2023 CLINICAL DATA:  Acute right lower quadrant abdominal pain. EXAM: CT ABDOMEN AND PELVIS WITH CONTRAST TECHNIQUE: Multidetector CT imaging of the abdomen and pelvis was performed using the standard protocol following bolus administration of intravenous contrast. RADIATION DOSE REDUCTION: This exam was performed according to the departmental dose-optimization program which includes automated exposure control, adjustment of the mA and/or kV according to patient size and/or use of iterative reconstruction technique. CONTRAST:  OMNIPAQUE IOHEXOL 300 MG/ML  SOLN COMPARISON:  None Available. FINDINGS: Lower chest: No acute abnormality. Hepatobiliary: No focal liver abnormality is seen. No gallstones, gallbladder wall thickening, or biliary dilatation. Pancreas: Unremarkable. No pancreatic ductal dilatation or surrounding inflammatory changes. Spleen: Normal in size without focal abnormality. Adrenals/Urinary Tract: Adrenal glands are  unremarkable. Kidneys are normal, without renal calculi, focal lesion, or hydronephrosis. Bladder is unremarkable. Stomach/Bowel: The stomach is unremarkable. There is no evidence of bowel obstruction or inflammation. The appendix is not visualized. Vascular/Lymphatic: No significant vascular findings are present. No enlarged abdominal or pelvic lymph nodes. Reproductive: Status post hysterectomy. No adnexal masses. Other: No abdominal wall hernia or abnormality. No abdominopelvic ascites. Musculoskeletal: No acute or significant osseous findings. IMPRESSION: No acute abnormality seen in the abdomen or pelvis. Electronically Signed   By: Lupita Raider M.D.   On: 05/07/2023 08:52    Procedures Procedures    Medications Ordered in ED Medications  acetaminophen (TYLENOL) tablet 650 mg (650 mg Oral Given 05/07/23 0715)  iohexol (OMNIPAQUE) 300 MG/ML  solution 100 mL (100 mLs Intravenous Contrast Given 05/07/23 8119)    ED Course/ Medical Decision Making/ A&P                             Medical Decision Making Amount and/or Complexity of Data Reviewed Labs: ordered.    Details: WBC normal.  Mildly worse anemia from baseline. Radiology: ordered and independent interpretation performed.    Details: No appendicitis.  Risk OTC drugs. Prescription drug management.   Urinalysis is not consistent with UTI. Thus, labs and CT obtained.  Besides some mild acute on chronic anemia, workup is overall unremarkable.  However given her symptoms I am suspicious for false negative urinalysis.  I asked her in private and she has 0 concern for STI and denies any vaginal symptoms.  Thus we will treat with antibiotics for likely cystitis, discussed potential risk of harm from antibiotics. Chart review shows in 2020 had pan sensitive E coli in urine culture.  Discussed return precautions.        Final Clinical Impression(s) / ED Diagnoses Final diagnoses:  Cystitis    Rx / DC Orders ED Discharge Orders          Ordered    nitrofurantoin, macrocrystal-monohydrate, (MACROBID) 100 MG capsule  2 times daily        05/07/23 1478              Pricilla Loveless, MD 05/07/23 [phone]

## 2023-08-23 ENCOUNTER — Encounter (HOSPITAL_BASED_OUTPATIENT_CLINIC_OR_DEPARTMENT_OTHER): Payer: Self-pay

## 2023-08-23 ENCOUNTER — Other Ambulatory Visit: Payer: Self-pay

## 2023-08-23 ENCOUNTER — Emergency Department (HOSPITAL_BASED_OUTPATIENT_CLINIC_OR_DEPARTMENT_OTHER)
Admission: EM | Admit: 2023-08-23 | Discharge: 2023-08-23 | Disposition: A | Discharge status: Home / Self Care | Payer: BC Managed Care – PPO | Attending: Emergency Medicine | Admitting: Emergency Medicine

## 2023-08-23 ENCOUNTER — Emergency Department (HOSPITAL_BASED_OUTPATIENT_CLINIC_OR_DEPARTMENT_OTHER): Payer: BC Managed Care – PPO

## 2023-08-23 DIAGNOSIS — I10 Essential (primary) hypertension: Secondary | ICD-10-CM | POA: Insufficient documentation

## 2023-08-23 DIAGNOSIS — Z79899 Other long term (current) drug therapy: Secondary | ICD-10-CM | POA: Insufficient documentation

## 2023-08-23 DIAGNOSIS — R748 Abnormal levels of other serum enzymes: Secondary | ICD-10-CM

## 2023-08-23 DIAGNOSIS — R7989 Other specified abnormal findings of blood chemistry: Secondary | ICD-10-CM | POA: Diagnosis not present

## 2023-08-23 DIAGNOSIS — M79604 Pain in right leg: Secondary | ICD-10-CM

## 2023-08-23 LAB — BASIC METABOLIC PANEL
Anion gap: 12 (ref 5–15)
BUN: 18 mg/dL (ref 6–20)
CO2: 26 mmol/L (ref 22–32)
Calcium: 10.2 mg/dL (ref 8.9–10.3)
Chloride: 99 mmol/L (ref 98–111)
Creatinine, Ser: 0.92 mg/dL (ref 0.44–1.00)
GFR, Estimated: >60 mL/min (ref >60)
Glucose, Bld: 216 mg/dL — ABNORMAL HIGH (ref 70–99)
Potassium: 3.6 mmol/L (ref 3.5–5.1)
Sodium: 137 mmol/L (ref 135–145)

## 2023-08-23 LAB — CBC WITH DIFFERENTIAL/PLATELET
Abs Immature Granulocytes: 0.02 10*3/uL (ref 0.00–0.07)
Basophils Absolute: 0 10*3/uL (ref 0.0–0.1)
Basophils Relative: 0 %
Eosinophils Absolute: 0.1 10*3/uL (ref 0.0–0.5)
Eosinophils Relative: 2 %
HCT: 35.4 % — ABNORMAL LOW (ref 36.0–46.0)
Hemoglobin: 11.5 g/dL — ABNORMAL LOW (ref 12.0–15.0)
Immature Granulocytes: 0 %
Lymphocytes Relative: 45 %
Lymphs Abs: 2.8 10*3/uL (ref 0.7–4.0)
MCH: 27.4 pg (ref 26.0–34.0)
MCHC: 32.5 g/dL (ref 30.0–36.0)
MCV: 84.3 fL (ref 80.0–100.0)
Monocytes Absolute: 0.5 10*3/uL (ref 0.1–1.0)
Monocytes Relative: 8 %
Neutro Abs: 2.8 10*3/uL (ref 1.7–7.7)
Neutrophils Relative %: 45 %
Platelets: 279 10*3/uL (ref 150–400)
RBC: 4.2 MIL/uL (ref 3.87–5.11)
RDW: 15.5 % (ref 11.5–15.5)
WBC: 6.3 10*3/uL (ref 4.0–10.5)
nRBC: 0 % (ref 0.0–0.2)

## 2023-08-23 LAB — MAGNESIUM: Magnesium: 1.9 mg/dL (ref 1.7–2.4)

## 2023-08-23 LAB — CK: Total CK: 241 U/L — ABNORMAL HIGH (ref 38–234)

## 2023-08-23 MED ORDER — IBUPROFEN 800 MG PO TABS
800.0000 mg | ORAL_TABLET | Freq: Once | ORAL | Status: AC
  Administered 2023-08-23: 800 mg via ORAL
  Filled 2023-08-23: qty 1

## 2023-08-23 MED ORDER — CYCLOBENZAPRINE HCL 5 MG PO TABS: 5.0000 mg | ORAL_TABLET | Freq: Three times a day (TID) | ORAL | 0 refills | Status: AC | PRN

## 2023-08-23 MED ORDER — CYCLOBENZAPRINE HCL 5 MG PO TABS
5.0000 mg | ORAL_TABLET | Freq: Once | ORAL | Status: AC
  Administered 2023-08-23: 5 mg via ORAL
  Filled 2023-08-23: qty 1

## 2023-08-23 NOTE — ED Provider Notes (Signed)
[place] [facility] Provider Note   CSN: [id number] Arrival date & time: 08/23/23  1636     History  Chief Complaint  Patient presents with   Leg Pain    Debra Mccormick is a 47 y.o. female, history of high blood pressure, high cholesterol, who presents to the ED secondary to right leg pain has been going on for 4 to 5 days.  She states she recently increased her simvastatin to 80 mg, and has had increased muscle aches, in the right leg.  She states is cramping, starts in her calf and goes to her buttocks.  Fell on the backside of her leg, and is worse at night.  Denies any trauma, no history of blood thinners.  Denies any shortness of breath, chest pain.     Home Medications Prior to Admission medications   Medication Sig Start Date End Date Taking? Authorizing Provider  cyclobenzaprine (FLEXERIL) 5 MG tablet Take 1 tablet (5 mg total) by mouth 3 (three) times daily as needed. 08/23/23  Yes Jeriko Kowalke L, PA  amLODipine (NORVASC) 5 MG tablet Take 5 mg by mouth daily. 02/11/14   [provider]  Azilsartan-Chlorthalidone (EDARBYCLOR) 40-25 MG TABS Take 1 Dose by mouth daily. 10/24/18 11/23/18  Virgina Norfolk, DO  canagliflozin (INVOKANA) 300 MG TABS tablet Take 300 mg by mouth daily before breakfast. Patient not taking: Reported on 07/04/2021    [provider]  chlorthalidone (HYGROTON) 25 MG tablet Take 25 mg by mouth daily.    [provider]  Cholecalciferol (VITAMIN D PO) Take 1 tablet by mouth daily as needed. Patient states she only takes when she thinks about it Patient not taking: Reported on 07/04/2021    [provider]  fluticasone (FLONASE) 50 MCG/ACT nasal spray Place 2 sprays into both nostrils daily. Patient not taking: Reported on 07/04/2021 12/15/17   Michela Pitcher A, PA-C  hydrochlorothiazide (HYDRODIURIL) 25 MG tablet Take 1 tablet (25 mg total) by mouth daily. Patient not taking: Reported on  08/11/2016 11/19/14   Willodean Rosenthal, MD  HYDROcodone-acetaminophen (NORCO) 5-325 MG tablet Take 1 tablet by mouth every 4 (four) hours as needed. 07/05/22   Pricilla Loveless, MD  lisinopril (PRINIVIL,ZESTRIL) 5 MG tablet Take 1 tablet (5 mg total) by mouth daily. Patient not taking: Reported on 07/04/2021 08/11/16   Shaune Pollack, MD  metFORMIN (GLUCOPHAGE-XR) 500 MG 24 hr tablet Take 2,000 mg by mouth every morning. 05/15/21   [provider]  miconazole (MONISTAT 7) 2 % vaginal cream Place 1 Applicatorful vaginally at bedtime. Apply for seven nights 07/10/21   Mayfair Bing, MD  naproxen (NAPROSYN) 375 MG tablet Take 1 tablet (375 mg total) by mouth 2 (two) times daily as needed. Patient not taking: Reported on 08/11/2016 03/18/15   Bridgett Larsson, MD  naproxen (NAPROSYN) 500 MG tablet Take 1 tablet (500 mg total) by mouth 2 (two) times daily. Patient not taking: Reported on 08/11/2016 04/08/16   Dowless, Lelon Mast Tripp, PA-C  naproxen (NAPROSYN) 500 MG tablet Take 500 mg by mouth 2 (two) times daily as needed for mild pain. Patient not taking: Reported on 07/04/2021    [provider]  nitrofurantoin, macrocrystal-monohydrate, (MACROBID) 100 MG capsule Take 1 capsule (100 mg total) by mouth 2 (two) times daily. 05/07/23   Pricilla Loveless, MD  ondansetron (ZOFRAN ODT) 4 MG disintegrating tablet Take 1 tablet (4 mg total) by mouth every 8 (eight) hours as needed for nausea or vomiting. Patient  not taking: Reported on 07/04/2021 03/22/19   Shaune Pollack, MD  phenazopyridine (PYRIDIUM) 200 MG tablet Take 1 tablet (200 mg total) by mouth 3 (three) times daily as needed for pain. Patient not taking: Reported on 07/04/2021 03/22/19   Shaune Pollack, MD  rosuvastatin (CRESTOR) 10 MG tablet Take 10 mg by mouth daily.    [provider]  telmisartan (MICARDIS) 80 MG tablet Take 80 mg by mouth every morning. Patient not taking: Reported on 07/04/2021 05/15/21   [provider]      Allergies    Orange fruit [citrus] and Tomato    Review of Systems   Review of Systems  Musculoskeletal:  Positive for myalgias. Negative for arthralgias.    Physical Exam Updated Vital Signs BP (!) 163/85   Pulse 83   Temp 98.2 F (36.8 C)   Resp 18   Ht 5\' 7"  (1.702 m)   Wt 105.2 kg   SpO2 99%   BMI 36.34 kg/m  Physical Exam Vitals and nursing note reviewed.  Constitutional:      General: She is not in acute distress.    Appearance: She is well-developed.  HENT:     Head: Normocephalic and atraumatic.  Eyes:     Conjunctiva/sclera: Conjunctivae normal.  Cardiovascular:     Rate and Rhythm: Normal rate and regular rhythm.     Heart sounds: No murmur heard. Pulmonary:     Effort: Pulmonary effort is normal. No respiratory distress.     Breath sounds: Normal breath sounds.  Abdominal:     Palpations: Abdomen is soft.     Tenderness: There is no abdominal tenderness.  Musculoskeletal:        General: No swelling.     Cervical back: Neck supple.     Comments: Tenderness to palpation of right calf, as well as popliteal area, and posterior thigh.  No edema noted.  No erythema.  Range of motion intact.  Positive dorsalis pedis pulse of the right lower extremity  Skin:    General: Skin is warm and dry.     Capillary Refill: Capillary refill takes less than 2 seconds.  Neurological:     Mental Status: She is alert.  Psychiatric:        Mood and Affect: Mood normal.     ED Results / Procedures / Treatments   Labs (all labs ordered are listed, but only abnormal results are displayed) Labs Reviewed  CBC WITH DIFFERENTIAL/PLATELET - Abnormal; Notable for the following components:      Result Value   Hemoglobin 11.5 (*)    HCT 35.4 (*)    All other components within normal limits  BASIC METABOLIC PANEL - Abnormal; Notable for the following components:   Glucose, Bld 216 (*)    All other components within normal limits  CK - Abnormal; Notable for the  following components:   Total CK 241 (*)    All other components within normal limits  MAGNESIUM    EKG None  Radiology US Venous Img Lower Right (DVT Study)  Result Date: 08/23/2023 CLINICAL DATA:  Right leg pain EXAM: Right LOWER EXTREMITY VENOUS DOPPLER ULTRASOUND TECHNIQUE: Gray-scale sonography with compression, as well as color and duplex ultrasound, were performed to evaluate the deep venous system(s) from the level of the common femoral vein through the popliteal and proximal calf veins. COMPARISON:  None Available. FINDINGS: VENOUS Normal compressibility of the common femoral, superficial femoral, and popliteal veins, as well as the visualized calf veins.  Visualized portions of profunda femoral vein and great saphenous vein unremarkable. No filling defects to suggest DVT on grayscale or color Doppler imaging. Doppler waveforms show normal direction of venous flow, normal respiratory plasticity and response to augmentation. Limited views of the contralateral common femoral vein are unremarkable. OTHER None. Limitations: none IMPRESSION: Negative. Electronically Signed   By: Jasmine Pang M.D.   On: 08/23/2023 21:42    Procedures Procedures    Medications Ordered in ED Medications  cyclobenzaprine (FLEXERIL) tablet 5 mg (5 mg Oral Given 08/23/23 2040)  ibuprofen (ADVIL) tablet 800 mg (800 mg Oral Given 08/23/23 2041)    ED Course/ Medical Decision Making/ A&P                                 Medical Decision Making Patient is a 47 year old female, here for cramping, of the right lower extremity, has been wearing for the last few days.  She states she was increased on her statin to 80 mg, and is worried she has a blood clot.  We will obtain a DVT study, to further evaluate, as well as a CK to check and see if she is developing rhabdo from her statin, and mag, and other electrolytes to evaluate for any electrolyte deficiency.  Ibuprofen, Flexeril for pain control  Amount and/or  Complexity of Data Reviewed Labs: ordered.    Details: CK mildly elevated, 241, mag, potassium within normal limits Discussion of management or test interpretation with external provider(s): Discussed with patient, her CK was mildly elevated, DVT study negative for any kind of DVT.  I believe that her symptoms are likely representative of statin intolerance.  I discussed with her she will need to follow-up with her primary care doctor, and drink lots of fluids, to help with this.  They may need to change her statin dosing.  Additionally her electrolytes were within normal limits, no evidence of DVT.  Started on Flexeril, for symptomatic control.  Feels better after drinking Gatorade.  Discharged home with strict return precautions.  Good pulse.  No neurodeficits noted on my exam.  Risk Prescription drug management.    Final Clinical Impression(s) / ED Diagnoses Final diagnoses:  Right leg pain  Elevated CK    Rx / DC Orders ED Discharge Orders          Ordered    cyclobenzaprine (FLEXERIL) 5 MG tablet  3 times daily PRN        08/23/23 2152              Elvis Laufer, Harley Alto, PA 08/23/23 2154    Loetta Rough, MD 08/24/23 2002

## 2023-08-23 NOTE — Discharge Instructions (Addendum)
Please follow-up with your primary care doctor, make sure you are drinking lots of fluids, and resting.  I believe that you may have a statin intolerance, please talk to your primary care doctor in regards to this.  I have also prescribed you some muscle relaxers, to help with some of the muscle spasms.  Return to the ER if you have any coolness of your extremity, any rash, or loss of sensation in your foot.

## 2023-08-23 NOTE — ED Triage Notes (Signed)
Pt reports pain in right leg x 4-5 days, pain worse at night. Pain originates in her calf and radiates up into her buttocks. Denies recent injury. Denies any other symptoms.

## 2023-08-23 NOTE — ED Notes (Signed)
Pt. Is drinking Gatorlyte O for oral hydration.

## 2024-05-11 ENCOUNTER — Other Ambulatory Visit (HOSPITAL_COMMUNITY)
Admission: RE | Admit: 2024-05-11 | Discharge: 2024-05-11 | Disposition: A | Discharge status: Home / Self Care | Source: Ambulatory Visit | Attending: Obstetrics and Gynecology | Admitting: Obstetrics and Gynecology

## 2024-05-11 ENCOUNTER — Ambulatory Visit: Admitting: Obstetrics and Gynecology

## 2024-05-11 ENCOUNTER — Other Ambulatory Visit: Payer: Self-pay

## 2024-05-11 ENCOUNTER — Encounter: Payer: Self-pay | Admitting: Obstetrics and Gynecology

## 2024-05-11 VITALS — BP 171/101 | HR 101 | Wt 247.7 lb

## 2024-05-11 DIAGNOSIS — R1030 Lower abdominal pain, unspecified: Secondary | ICD-10-CM

## 2024-05-11 DIAGNOSIS — N760 Acute vaginitis: Secondary | ICD-10-CM | POA: Diagnosis not present

## 2024-05-11 DIAGNOSIS — Z01411 Encounter for gynecological examination (general) (routine) with abnormal findings: Secondary | ICD-10-CM | POA: Diagnosis not present

## 2024-05-11 DIAGNOSIS — Z01419 Encounter for gynecological examination (general) (routine) without abnormal findings: Secondary | ICD-10-CM

## 2024-05-11 LAB — POCT URINALYSIS DIP (DEVICE)
Bilirubin Urine: NEGATIVE
Glucose, UA: NEGATIVE mg/dL
Hgb urine dipstick: NEGATIVE
Ketones, ur: NEGATIVE mg/dL
Leukocytes,Ua: NEGATIVE
Nitrite: NEGATIVE
Protein, ur: NEGATIVE mg/dL
Specific Gravity, Urine: >=1.03 (ref 1.005–1.030)
Urobilinogen, UA: 0.2 mg/dL (ref 0.0–1.0)
pH: 5.5 (ref 5.0–8.0)

## 2024-05-11 NOTE — Progress Notes (Signed)
 ANNUAL EXAM Patient name: Debra Mccormick MRN [id number]  Date of birth: 03/18/1976 Chief Complaint:   Gynecologic Exam  History of Present Illness:   Debra Mccormick is a 48 y.o. G3P3003 being seen today for a routine annual exam.  Current complaints: annual  Menstrual concerns? No  s/p hysterectomy Breast or nipple changes? No  Contraception use? Yes s/p hysterectomy Sexually active? Yes but technically not really now   Lower abdominal discomfort and vaignal discomfort when she presses on her abdomen. Notes a history of prior ovarian cyst.   No itching with the discharge. Has yeast infections due the DM. No burning. Sleeping in separate rooms from partner currently   No LMP recorded. Patient has had a hysterectomy.   The pregnancy intention screening data noted above was reviewed. Potential methods of contraception were discussed. The patient elected to proceed with No data recorded.   Last pap     Component Value Date/Time   DIAGPAP  07/04/2021 0915    - Negative for intraepithelial lesion or malignancy (NILM)   HPVHIGH Negative 07/04/2021 0915   ADEQPAP Satisfactory for evaluation. 07/04/2021 0915   Last mammogram: n/a.  Last colonoscopy: n/a.      08/07/2021    2:58 PM  Depression screen PHQ 2/9  Decreased Interest 0  Down, Depressed, Hopeless 0  PHQ - 2 Score 0  Altered sleeping 0  Tired, decreased energy 0  Change in appetite 0  Feeling bad or failure about yourself  0  Trouble concentrating 0  Moving slowly or fidgety/restless 0  Suicidal thoughts 0  PHQ-9 Score 0        08/07/2021    2:58 PM  GAD 7 : Generalized Anxiety Score  Nervous, Anxious, on Edge 0  Control/stop worrying 0  Worry too much - different things 0  Trouble relaxing 0  Restless 0  Easily annoyed or irritable 0  Afraid - awful might happen 0  Total GAD 7 Score 0     Review of Systems:   Pertinent items are noted in HPI Denies any headaches, blurred vision, fatigue,  shortness of breath, chest pain, abdominal pain, abnormal vaginal discharge/itching/odor/irritation, problems with periods, bowel movements, urination, or intercourse unless otherwise stated above. Pertinent History Reviewed:  Reviewed past medical,surgical, social and family history.  Reviewed problem list, medications and allergies. Physical Assessment:   Vitals:   05/11/24 1507  BP: (!) 159/98  Pulse: (!) 101  Weight: 247 lb 11.2 oz (112.4 kg)  Body mass index is 38.8 kg/m.        Physical Examination:   General appearance - well appearing, and in no distress  Mental status - alert, oriented to person, place, and time  Psych:  She has a normal mood and affect  Skin - warm and dry, normal color, no suspicious lesions noted  Chest - effort normal, all lung fields clear to auscultation bilaterally  Heart - normal rate and regular rhythm  Abdomen - soft, mildly tender abdomen, + carnett sign   Pelvic -  VULVA: normal appearing vulva with no masses, tenderness or lesions   VAGINA: normal appearing vagina with normal color and discharge, no lesions    Extremities:  No swelling or varicosities noted  Chaperone present for exam  No results found for this or any previous visit (from the past 24 hours).    Assessment & Plan:  1. Well woman exam with routine gynecological exam (Primary) - STD Testing: declines - Birth Control: Discussed options  and their risks, benefits and common side effects; discussed VTE with estrogen containing options. Desires: s/p hysterectomy - Breast Health: Encouraged self breast awareness/SBE. Teaching provided. Discussed limits of clinical breast exam for detecting breast cancer. 06/2023 - F/U 12 months and prn\  2. Acute vaginitis Vaginitis completed. Typically responds well to a single diflucan  pills  - Cervicovaginal ancillary only  3. Lower abdominal pain Assess for UTI - POCT urinalysis dip (device)  Orders Placed This Encounter  Procedures    POCT urinalysis dip (device)    Meds: No orders of the defined types were placed in this encounter.   Follow-up: No follow-ups on file.  Carter Quarry, MD 05/11/2024 3:33 PM

## 2024-05-12 ENCOUNTER — Ambulatory Visit: Payer: Self-pay | Admitting: Obstetrics and Gynecology

## 2024-05-12 DIAGNOSIS — B9689 Other specified bacterial agents as the cause of diseases classified elsewhere: Secondary | ICD-10-CM

## 2024-05-12 LAB — CERVICOVAGINAL ANCILLARY ONLY
Bacterial Vaginitis (gardnerella): POSITIVE — AB
Candida Glabrata: NEGATIVE
Candida Vaginitis: NEGATIVE
Comment: NEGATIVE
Comment: NEGATIVE
Comment: NEGATIVE

## 2024-05-12 MED ORDER — METRONIDAZOLE 500 MG PO TABS: 500.0000 mg | ORAL_TABLET | Freq: Two times a day (BID) | ORAL | 0 refills | Status: AC

## 2024-05-15 ENCOUNTER — Other Ambulatory Visit: Payer: Self-pay | Admitting: *Deleted

## 2024-05-15 MED ORDER — FLUCONAZOLE 150 MG PO TABS: 150.0000 mg | ORAL_TABLET | Freq: Once | ORAL | 0 refills | Status: DC

## 2024-06-17 LAB — COLOGUARD: COLOGUARD: NEGATIVE

## 2024-09-04 ENCOUNTER — Other Ambulatory Visit: Payer: Self-pay | Admitting: Obstetrics and Gynecology

## 2024-10-14 ENCOUNTER — Encounter (HOSPITAL_BASED_OUTPATIENT_CLINIC_OR_DEPARTMENT_OTHER): Payer: Self-pay | Admitting: Emergency Medicine

## 2024-10-14 ENCOUNTER — Emergency Department (HOSPITAL_BASED_OUTPATIENT_CLINIC_OR_DEPARTMENT_OTHER)
Admission: EM | Admit: 2024-10-14 | Discharge: 2024-10-14 | Disposition: A | Discharge status: Home / Self Care | Attending: Emergency Medicine | Admitting: Emergency Medicine

## 2024-10-14 ENCOUNTER — Other Ambulatory Visit: Payer: Self-pay

## 2024-10-14 DIAGNOSIS — H1032 Unspecified acute conjunctivitis, left eye: Secondary | ICD-10-CM | POA: Insufficient documentation

## 2024-10-14 DIAGNOSIS — H5712 Ocular pain, left eye: Secondary | ICD-10-CM | POA: Diagnosis present

## 2024-10-14 MED ORDER — ERYTHROMYCIN 5 MG/GM OP OINT
TOPICAL_OINTMENT | Freq: Three times a day (TID) | OPHTHALMIC | Status: DC
  Administered 2024-10-14: 1 via OPHTHALMIC
  Filled 2024-10-14: qty 3.5

## 2024-10-14 MED ORDER — TETRACAINE HCL 0.5 % OP SOLN
2.0000 [drp] | Freq: Once | OPHTHALMIC | Status: AC
  Administered 2024-10-14: 2 [drp] via OPHTHALMIC
  Filled 2024-10-14: qty 4

## 2024-10-14 MED ORDER — FLUORESCEIN SODIUM 1 MG OP STRP
1.0000 | ORAL_STRIP | Freq: Once | OPHTHALMIC | Status: AC
  Administered 2024-10-14: 1 via OPHTHALMIC
  Filled 2024-10-14: qty 1

## 2024-10-14 NOTE — Discharge Instructions (Signed)
 Use the eye ointment every 8 hours for the next 5 days. If your symptoms do not improve over the next 3 days, or if symptoms worsen, please call your ophthalmologist, Dr. Jama, for a recheck. Return to the ED for re-evaluation as needed.

## 2024-10-14 NOTE — ED Provider Notes (Signed)
 " [facility] Provider Note   CSN: [id number] Arrival date & time: 10/14/24  1226     Patient presents with: Eye Pain   Debra Mccormick is a 48 y.o. female.   Patient to ED with soreness in the left lower and medial eye lid. No swelling noted. She report clear drainage from the eye without purulence. No vision changes, no headache, no nausea. She is not aware of any exposure and foreign bodies entering the eye. Symptoms were present when she woke this morning.  The history is provided by the patient. No language interpreter was used.  Eye Pain       Prior to Admission medications  Medication Sig Start Date End Date Taking? Authorizing Provider  Azilsartan-Chlorthalidone  (EDARBYCLOR) 40-25 MG TABS Take 1 Dose by mouth daily. 10/24/18 05/11/24  Ruthe Cornet, DO  canagliflozin (INVOKANA) 300 MG TABS tablet Take 300 mg by mouth daily before breakfast. Patient not taking: Reported on 05/11/2024    [provider]  chlorthalidone  (HYGROTON ) 25 MG tablet Take 25 mg by mouth daily.    [provider]  Cholecalciferol (VITAMIN D PO) Take 1 tablet by mouth daily as needed. Patient states she only takes when she thinks about it Patient not taking: Reported on 05/11/2024    [provider]  cyclobenzaprine  (FLEXERIL ) 5 MG tablet Take 1 tablet (5 mg total) by mouth 3 (three) times daily as needed. Patient not taking: Reported on 05/11/2024 08/23/23   Small, Brooke L, PA  hydrochlorothiazide  (HYDRODIURIL ) 25 MG tablet Take 1 tablet (25 mg total) by mouth daily. Patient not taking: Reported on 05/11/2024 11/19/14   Corene Coy, MD  losartan-hydrochlorothiazide  (HYZAAR) 100-25 MG tablet Take 1 tablet by mouth daily.    [provider]  miconazole  (MONISTAT  7) 2 % vaginal cream Place 1 Applicatorful vaginally at bedtime. Apply for seven nights 07/10/21   Izell Harari, MD  naproxen  (NAPROSYN ) 375 MG tablet  Take 1 tablet (375 mg total) by mouth 2 (two) times daily as needed. Patient not taking: Reported on 05/11/2024 03/18/15   Lucia Barefoot, MD  naproxen  (NAPROSYN ) 500 MG tablet Take 1 tablet (500 mg total) by mouth 2 (two) times daily. Patient not taking: Reported on 05/11/2024 04/08/16   Dowless, Samantha Tripp, PA-C  naproxen  (NAPROSYN ) 500 MG tablet Take 500 mg by mouth 2 (two) times daily as needed for mild pain. Patient not taking: Reported on 05/11/2024    [provider]  nitrofurantoin , macrocrystal-monohydrate, (MACROBID ) 100 MG capsule Take 1 capsule (100 mg total) by mouth 2 (two) times daily. Patient not taking: Reported on 05/11/2024 05/07/23   Freddi Hamilton, MD  ondansetron  (ZOFRAN  ODT) 4 MG disintegrating tablet Take 1 tablet (4 mg total) by mouth every 8 (eight) hours as needed for nausea or vomiting. Patient not taking: Reported on 05/11/2024 03/22/19   Angelena Smalls, MD  phenazopyridine  (PYRIDIUM ) 200 MG tablet Take 1 tablet (200 mg total) by mouth 3 (three) times daily as needed for pain. Patient not taking: Reported on 05/11/2024 03/22/19   Angelena Smalls, MD  telmisartan (MICARDIS) 80 MG tablet Take 80 mg by mouth every morning. Patient not taking: Reported on 05/11/2024 05/15/21   [provider]    Allergies: Orange fruit [citrus] and Tomato    Review of Systems  Eyes:  Positive for pain.    Updated Vital Signs BP (!) 180/95 (BP Location: Right Arm)   Pulse 93   Temp 98.7 F (37.1  C) (Oral)   Resp 17   Ht 5' 6 (1.676 m)   Wt 113.4 kg   SpO2 100%   BMI 40.35 kg/m   Physical Exam Vitals and nursing note reviewed.  Constitutional:      General: She is not in acute distress.    Appearance: She is well-developed. She is not ill-appearing.  Eyes:     General: Lids are everted, no foreign bodies appreciated. Vision grossly intact. Gaze aligned appropriately. No visual field deficit.       Left eye: Discharge (Minimal yellow discharge collecting at  medial cathus of the left eye.) present.No foreign body.     Extraocular Movements: Extraocular movements intact.     Left eye: Normal extraocular motion and no nystagmus.     Conjunctiva/sclera:     Left eye: Exudate present. No chemosis or hemorrhage.    Pupils: Pupils are equal, round, and reactive to light.     Left eye: No corneal abrasion or fluorescein  uptake.     Slit lamp exam:    Left eye: No hyphema or photophobia.   Cardiovascular:     Rate and Rhythm: Normal rate.  Pulmonary:     Effort: Pulmonary effort is normal.  Musculoskeletal:        General: Normal range of motion.     Cervical back: Normal range of motion.  Skin:    General: Skin is warm and dry.  Neurological:     Mental Status: She is alert and oriented to person, place, and time.     (all labs ordered are listed, but only abnormal results are displayed) Labs Reviewed - No data to display  EKG: None  Radiology: No results found.   Procedures   Medications Ordered in the ED  erythromycin  ophthalmic ointment (1 Application Left Eye Given 10/14/24 1450)  tetracaine  (PONTOCAINE) 0.5 % ophthalmic solution 2 drop (2 drops Left Eye Given 10/14/24 1421)  fluorescein  ophthalmic strip 1 strip (1 strip Left Eye Given 10/14/24 1421)    Clinical Course as of 10/14/24 1453  Sat Oct 14, 2024  1445 Patient to ED with left eye soreness limited to the lower lid at the medial canthus. There is a small collection of exudate. Conjunctiva are not significantly swollen, mildly erythematous. Cornea clear.   Pressure slightly elevated at 28 and 30 in the left eye. She states she was just seen by her ophthalmologist for eye evaluation in the setting of diabetes and she states pressures were checked and not reported as concerning or abnormal. Question readings today. Recommend recheck with her optho in 1-2 weeks for repeat in office.   Will start erythromycin  ointment. She is cautioned that if no better in 3 days, or if  symptoms worsen, to be seen by her eye doctor or return to ED.  [SU]    Clinical Course User Index [SU] Odell Balls, PA-C                                 Medical Decision Making Risk Prescription drug management.        Final diagnoses:  Acute bacterial conjunctivitis of left eye    ED Discharge Orders     None          Odell Balls, PA-C 10/14/24 1453    Lenor Hollering, MD 10/14/24 1503  "

## 2024-10-14 NOTE — ED Notes (Signed)
 Patient transferred from waiting room to ED treatment room. Assuming pt care at this time.

## 2024-10-14 NOTE — ED Triage Notes (Signed)
 Pt woke today with irritation to LT eye; feels gritty; denies visual change

## 2024-10-24 ENCOUNTER — Other Ambulatory Visit: Payer: Self-pay | Admitting: Obstetrics and Gynecology
